# Patient Record
Sex: Female | Born: 1995 | Race: Black or African American | Hispanic: No | Marital: Married | Smoking: Never smoker
Health system: Southern US, Community
[De-identification: ages and names within clinical notes are randomized; demographics above are authoritative.]

## PROBLEM LIST (undated history)

## (undated) DIAGNOSIS — D649 Anemia, unspecified: Secondary | ICD-10-CM

## (undated) DIAGNOSIS — A6000 Herpesviral infection of urogenital system, unspecified: Secondary | ICD-10-CM

## (undated) HISTORY — PX: ANKLE SURGERY: SHX546

## (undated) HISTORY — DX: Anemia, unspecified: D64.9

---

## 2013-07-06 ENCOUNTER — Emergency Department (HOSPITAL_COMMUNITY)
Admission: EM | Admit: 2013-07-06 | Discharge: 2013-07-07 | Disposition: A | Discharge status: Home / Self Care | Payer: BC Managed Care – PPO | Attending: Emergency Medicine | Admitting: Emergency Medicine

## 2013-07-06 ENCOUNTER — Encounter (HOSPITAL_COMMUNITY): Payer: Self-pay | Admitting: Emergency Medicine

## 2013-07-06 DIAGNOSIS — A6000 Herpesviral infection of urogenital system, unspecified: Secondary | ICD-10-CM | POA: Insufficient documentation

## 2013-07-06 DIAGNOSIS — Z79899 Other long term (current) drug therapy: Secondary | ICD-10-CM | POA: Insufficient documentation

## 2013-07-06 DIAGNOSIS — Z3202 Encounter for pregnancy test, result negative: Secondary | ICD-10-CM | POA: Insufficient documentation

## 2013-07-06 DIAGNOSIS — N898 Other specified noninflammatory disorders of vagina: Secondary | ICD-10-CM | POA: Insufficient documentation

## 2013-07-06 HISTORY — DX: Herpesviral infection of urogenital system, unspecified: A60.00

## 2013-07-06 NOTE — ED Notes (Addendum)
Pt c/o vaginal bleeding (spotting) x 3 days. Denies fever, denies n/v/d. Denies pain, denies urinary s/s

## 2013-07-07 LAB — WET PREP, GENITAL
Trich, Wet Prep: NONE SEEN
Yeast Wet Prep HPF POC: NONE SEEN

## 2013-07-07 LAB — GC/CHLAMYDIA PROBE AMP
CT Probe RNA: NEGATIVE
GC Probe RNA: NEGATIVE

## 2013-07-07 LAB — POCT PREGNANCY, URINE: Preg Test, Ur: NEGATIVE

## 2013-07-07 MED ORDER — LIDOCAINE HCL 1 % IJ SOLN
INTRAMUSCULAR | Status: AC
  Administered 2013-07-07: 20 mL
  Filled 2013-07-07: qty 20

## 2013-07-07 MED ORDER — AZITHROMYCIN 250 MG PO TABS
1000.0000 mg | ORAL_TABLET | Freq: Once | ORAL | Status: AC
  Administered 2013-07-07: 1000 mg via ORAL
  Filled 2013-07-07: qty 4

## 2013-07-07 MED ORDER — CEFTRIAXONE SODIUM 250 MG IJ SOLR
250.0000 mg | Freq: Once | INTRAMUSCULAR | Status: AC
  Administered 2013-07-07: 250 mg via INTRAMUSCULAR
  Filled 2013-07-07: qty 250

## 2013-07-07 NOTE — ED Provider Notes (Signed)
Medical screening examination/treatment/procedure(s) were performed by non-physician practitioner and as supervising physician I was immediately available for consultation/collaboration.  Sunnie Nielsen, MD 07/07/13 [phone]

## 2013-07-07 NOTE — ED Provider Notes (Signed)
CSN: [id number]     Arrival date & time 07/06/13  2332 History     First MD Initiated Contact with Patient 07/06/13 2353     Chief Complaint  Patient presents with  . Vaginal Bleeding   (Consider location/radiation/quality/duration/timing/severity/associated sxs/prior Treatment) HPI Comments: LMP 1 week ago normal uses BC pills has not missed any, noticed a vaginal discharge 3 days ago that may have a bloody tinge to it.  Denies dysuria, N/V/C, fever, myalgia   Patient is a 17 y.o. female presenting with vaginal bleeding. The history is provided by the patient.  Vaginal Bleeding Quality:  Lighter than menses Severity:  Mild Onset quality:  Gradual Duration:  3 days Timing:  Intermittent Progression:  Unchanged Menstrual history:  Regular Possible pregnancy: no   Context: spontaneously   Relieved by:  None tried Worsened by:  Nothing tried Ineffective treatments:  None tried Associated symptoms: vaginal discharge   Associated symptoms: no abdominal pain, no back pain, no dizziness, no dyspareunia, no dysuria, no fatigue, no fever and no nausea   Risk factors: unprotected sex   Risk factors: no gynecological surgery     Past Medical History  Diagnosis Date  . Genital herpes    Past Surgical History  Procedure Laterality Date  . Ankle surgery Right    No family history on file. History  Substance Use Topics  . Smoking status: Never Smoker   . Smokeless tobacco: Not on file  . Alcohol Use: No   OB History   Grav Para Term Preterm Abortions TAB SAB Ect Mult Living                 Review of Systems  Constitutional: Negative for fever and fatigue.  Gastrointestinal: Negative for nausea, vomiting and abdominal pain.  Genitourinary: Positive for vaginal bleeding and vaginal discharge. Negative for dysuria, frequency, flank pain, menstrual problem and dyspareunia.  Musculoskeletal: Negative for back pain.  Skin: Negative for rash.  Neurological: Negative for dizziness.   All other systems reviewed and are negative.    Allergies  Review of patient's allergies indicates no known allergies.  Home Medications   Current Outpatient Rx  Name  Route  Sig  Dispense  Refill  . acetaminophen (TYLENOL) 500 MG tablet   Oral   Take 500 mg by mouth every 6 (six) hours as needed for pain.         Marland Kitchen acyclovir (ZOVIRAX) 400 MG tablet   Oral   Take 400 mg by mouth 2 (two) times daily.         Marland Kitchen PRESCRIPTION MEDICATION   Oral   Take 1 tablet by mouth every morning. Birth control          BP 130/71  Pulse 82  Temp(Src) 98.8 F (37.1 C) (Oral)  Resp 16  Ht 5\' 9"  (1.753 m)  Wt 148 lb (67.132 kg)  BMI 21.85 kg/m2  SpO2 100%  LMP 06/23/2013 Physical Exam  Constitutional: She appears well-developed and well-nourished.  HENT:  Head: Normocephalic.  Eyes: Pupils are equal, round, and reactive to light.  Neck: Normal range of motion.  Cardiovascular: Normal rate.   Pulmonary/Chest: Effort normal and breath sounds normal.  Abdominal: Soft. Bowel sounds are normal. She exhibits no distension.  Genitourinary: Guaiac negative stool. Uterus is not enlarged and not tender. Cervix exhibits no discharge. Right adnexum displays no tenderness and no fullness. Left adnexum displays no tenderness and no fullness. There is tenderness around the vagina. Vaginal discharge found.  Musculoskeletal: Normal range of motion.  Neurological: She is alert.  Skin: Skin is warm and dry. No rash noted. No erythema.  Psychiatric: Her behavior is normal.    ED Course   Procedures (including critical care time)  Labs Reviewed  WET PREP, GENITAL - Abnormal; Notable for the following:    Clue Cells Wet Prep HPF POC FEW (*)    WBC, Wet Prep HPF POC FEW (*)    All other components within normal limits  GC/CHLAMYDIA PROBE AMP  POCT PREGNANCY, URINE   No results found. 1. Vaginal discharge     MDM     Arman Filter, NP 07/07/13 0147

## 2013-07-07 NOTE — ED Notes (Signed)
Pelvic setup at bedside.

## 2015-04-10 ENCOUNTER — Emergency Department (HOSPITAL_COMMUNITY): Payer: Medicaid Other

## 2015-04-10 ENCOUNTER — Emergency Department (HOSPITAL_COMMUNITY)
Admission: EM | Admit: 2015-04-10 | Discharge: 2015-04-10 | Disposition: A | Discharge status: Home / Self Care | Payer: Medicaid Other | Attending: Emergency Medicine | Admitting: Emergency Medicine

## 2015-04-10 ENCOUNTER — Encounter (HOSPITAL_COMMUNITY): Payer: Self-pay | Admitting: Emergency Medicine

## 2015-04-10 DIAGNOSIS — Y92009 Unspecified place in unspecified non-institutional (private) residence as the place of occurrence of the external cause: Secondary | ICD-10-CM | POA: Diagnosis not present

## 2015-04-10 DIAGNOSIS — Z9889 Other specified postprocedural states: Secondary | ICD-10-CM | POA: Insufficient documentation

## 2015-04-10 DIAGNOSIS — Y998 Other external cause status: Secondary | ICD-10-CM | POA: Diagnosis not present

## 2015-04-10 DIAGNOSIS — Z8619 Personal history of other infectious and parasitic diseases: Secondary | ICD-10-CM | POA: Diagnosis not present

## 2015-04-10 DIAGNOSIS — W1849XA Other slipping, tripping and stumbling without falling, initial encounter: Secondary | ICD-10-CM | POA: Insufficient documentation

## 2015-04-10 DIAGNOSIS — Z793 Long term (current) use of hormonal contraceptives: Secondary | ICD-10-CM | POA: Diagnosis not present

## 2015-04-10 DIAGNOSIS — S93401A Sprain of unspecified ligament of right ankle, initial encounter: Secondary | ICD-10-CM

## 2015-04-10 DIAGNOSIS — Y9389 Activity, other specified: Secondary | ICD-10-CM | POA: Insufficient documentation

## 2015-04-10 DIAGNOSIS — S99911A Unspecified injury of right ankle, initial encounter: Secondary | ICD-10-CM | POA: Diagnosis present

## 2015-04-10 MED ORDER — TRAMADOL HCL 50 MG PO TABS
50.0000 mg | ORAL_TABLET | Freq: Once | ORAL | Status: AC
  Administered 2015-04-10: 50 mg via ORAL
  Filled 2015-04-10: qty 1

## 2015-04-10 MED ORDER — IBUPROFEN 600 MG PO TABS: 600.0000 mg | ORAL_TABLET | Freq: Four times a day (QID) | ORAL | Status: DC | PRN

## 2015-04-10 NOTE — ED Notes (Signed)
Patient is alert and oriented x3.  She was given DC instructions and follow up visit instructions.  Patient gave verbal understanding. She was DC ambulatory under her own power to home.  V/S stable.  He was not showing any signs of distress on DC 

## 2015-04-10 NOTE — Discharge Instructions (Signed)
Ankle Sprain °An ankle sprain is an injury to the strong, fibrous tissues (ligaments) that hold the bones of your ankle joint together.  °CAUSES °An ankle sprain is usually caused by a fall or by twisting your ankle. Ankle sprains most commonly occur when you step on the outer edge of your foot, and your ankle turns inward. People who participate in sports are more prone to these types of injuries.  °SYMPTOMS  °· Pain in your ankle. The pain may be present at rest or only when you are trying to stand or walk. °· Swelling. °· Bruising. Bruising may develop immediately or within 1 to 2 days after your injury. °· Difficulty standing or walking, particularly when turning corners or changing directions. °DIAGNOSIS  °Your caregiver will ask you details about your injury and perform a physical exam of your ankle to determine if you have an ankle sprain. During the physical exam, your caregiver will press on and apply pressure to specific areas of your foot and ankle. Your caregiver will try to move your ankle in certain ways. An X-ray exam may be done to be sure a bone was not broken or a ligament did not separate from one of the bones in your ankle (avulsion fracture).  °TREATMENT  °Certain types of braces can help stabilize your ankle. Your caregiver can make a recommendation for this. Your caregiver may recommend the use of medicine for pain. If your sprain is severe, your caregiver may refer you to a surgeon who helps to restore function to parts of your skeletal system (orthopedist) or a physical therapist. °HOME CARE INSTRUCTIONS  °· Apply ice to your injury for 1-2 days or as directed by your caregiver. Applying ice helps to reduce inflammation and pain. °· Put ice in a plastic bag. °· Place a towel between your skin and the bag. °· Leave the ice on for 15-20 minutes at a time, every 2 hours while you are awake. °· Only take over-the-counter or prescription medicines for pain, discomfort, or fever as directed by  your caregiver. °· Elevate your injured ankle above the level of your heart as much as possible for 2-3 days. °· If your caregiver recommends crutches, use them as instructed. Gradually put weight on the affected ankle. Continue to use crutches or a cane until you can walk without feeling pain in your ankle. °· If you have a plaster splint, wear the splint as directed by your caregiver. Do not rest it on anything harder than a pillow for the first 24 hours. Do not put weight on it. Do not get it wet. You may take it off to take a shower or bath. °· You may have been given an elastic bandage to wear around your ankle to provide support. If the elastic bandage is too tight (you have numbness or tingling in your foot or your foot becomes cold and blue), adjust the bandage to make it comfortable. °· If you have an air splint, you may blow more air into it or let air out to make it more comfortable. You may take your splint off at night and before taking a shower or bath. Wiggle your toes in the splint several times per day to decrease swelling. °SEEK MEDICAL CARE IF:  °· You have rapidly increasing bruising or swelling. °· Your toes feel extremely cold or you lose feeling in your foot. °· Your pain is not relieved with medicine. °SEEK IMMEDIATE MEDICAL CARE IF: °· Your toes are numb or blue. °·   You have severe pain that is increasing. °MAKE SURE YOU:  °· Understand these instructions. °· Will watch your condition. °· Will get help right away if you are not doing well or get worse. °Document Released: 11/14/2005 Document Revised: 08/08/2012 Document Reviewed: 11/26/2011 °ExitCare® Patient Information ©2015 [employer]. This information is not intended to replace advice given to you by your health care provider. Make sure you discuss any questions you have with your health care provider. ° ° ° °RICE: Routine Care for Injuries °The routine care of many injuries includes Rest, Ice, Compression, and Elevation (RICE). °HOME  CARE INSTRUCTIONS °· Rest is needed to allow your body to heal. Routine activities can usually be resumed when comfortable. Injured tendons and bones can take up to 6 weeks to heal. Tendons are the cord-like structures that attach muscle to bone. °· Ice following an injury helps keep the swelling down and reduces pain. °¨ Put ice in a plastic bag. °¨ Place a towel between your skin and the bag. °¨ Leave the ice on for 15-20 minutes, 3-4 times a day, or as directed by your health care provider. Do this while awake, for the first 24 to 48 hours. After that, continue as directed by your caregiver. °· Compression helps keep swelling down. It also gives support and helps with discomfort. If an elastic bandage has been applied, it should be removed and reapplied every 3 to 4 hours. It should not be applied tightly, but firmly enough to keep swelling down. Watch fingers or toes for swelling, bluish discoloration, coldness, numbness, or excessive pain. If any of these problems occur, remove the bandage and reapply loosely. Contact your caregiver if these problems continue. °· Elevation helps reduce swelling and decreases pain. With extremities, such as the arms, hands, legs, and feet, the injured area should be placed near or above the level of the heart, if possible. °SEEK IMMEDIATE MEDICAL CARE IF: °· You have persistent pain and swelling. °· You develop redness, numbness, or unexpected weakness. °· Your symptoms are getting worse rather than improving after several days. °These symptoms may indicate that further evaluation or further X-rays are needed. Sometimes, X-rays may not show a small broken bone (fracture) until 1 week or 10 days later. Make a follow-up appointment with your caregiver. Ask when your X-ray results will be ready. Make sure you get your X-ray results. °Document Released: 02/26/2001 Document Revised: 11/19/2013 Document Reviewed: 04/15/2011 °ExitCare® Patient Information ©2015 [employer]. This  information is not intended to replace advice given to you by your health care provider. Make sure you discuss any questions you have with your health care provider. ° °

## 2015-04-10 NOTE — ED Provider Notes (Signed)
CSN: [id number]     Arrival date & time 04/10/15  [id number] History   First MD Initiated Contact with Patient 04/10/15 0207     Chief Complaint  Patient presents with  . Ankle Injury    (Consider location/radiation/quality/duration/timing/severity/associated sxs/prior Treatment) Patient is a 19 y.o. female presenting with foot injury. The history is provided by the patient. No language interpreter was used.  Foot Injury Location:  Ankle Time since incident:  2 hours Injury: yes   Mechanism of injury comment:  Twisted while walking down steps Ankle location:  R ankle Pain details:    Quality:  Aching and throbbing   Radiates to:  Does not radiate   Onset quality:  Sudden   Timing:  Constant   Progression:  Waxing and waning Chronicity:  New Foreign body present:  No foreign bodies Prior injury to area:  Yes (Prior fx requiring ORIF) Relieved by:  Nothing Worsened by:  Bearing weight Ineffective treatments:  None tried Associated symptoms: swelling   Associated symptoms: no decreased ROM, no muscle weakness, no numbness and no tingling     Past Medical History  Diagnosis Date  . Genital herpes    Past Surgical History  Procedure Laterality Date  . Ankle surgery Right    Family History  Problem Relation Age of Onset  . Hypertension Other   . Diabetes Other   . Cancer Other    History  Substance Use Topics  . Smoking status: Never Smoker   . Smokeless tobacco: Not on file  . Alcohol Use: Yes     Comment: occ   OB History    No data available      Review of Systems  Musculoskeletal: Positive for joint swelling and arthralgias.  All other systems reviewed and are negative.   Allergies  Review of patient's allergies indicates no known allergies.  Home Medications   Prior to Admission medications   Medication Sig Start Date End Date Taking? Authorizing Provider  acetaminophen (TYLENOL) 500 MG tablet Take 500 mg by mouth every 6 (six) hours as needed for pain.     Historical Provider, MD  acyclovir (ZOVIRAX) 400 MG tablet Take 400 mg by mouth 2 (two) times daily.    Historical Provider, MD  ibuprofen (ADVIL,MOTRIN) 600 MG tablet Take 1 tablet (600 mg total) by mouth every 6 (six) hours as needed. 04/10/15   Antony MaduraKelly Mykira Hofmeister, PA-C  PRESCRIPTION MEDICATION Take 1 tablet by mouth every morning. Birth control    Historical Provider, MD   BP 112/61 mmHg  Pulse 89  Temp(Src) 98.4 F (36.9 C) (Oral)  Resp 16  SpO2 99%   Physical Exam  Constitutional: She is oriented to person, place, and time. She appears well-developed and well-nourished. No distress.  HENT:  Head: Normocephalic and atraumatic.  Eyes: Conjunctivae and EOM are normal. No scleral icterus.  Neck: Normal range of motion.  Cardiovascular: Normal rate, regular rhythm and intact distal pulses.   DP and PT pulses 2+ in the right lower extremity  Pulmonary/Chest: Effort normal. No respiratory distress.  Musculoskeletal: Normal range of motion.       Right ankle: She exhibits swelling. She exhibits normal range of motion, no deformity and normal pulse. Tenderness. Medial malleolus tenderness found. Achilles tendon normal.       Right foot: Normal.  Neurological: She is alert and oriented to person, place, and time. She exhibits normal muscle tone. Coordination normal.  Sensation to light touch intact. Patient able to wiggle all toes.  Skin: Skin is warm and dry. No rash noted. She is not diaphoretic. No erythema. No pallor.  Psychiatric: She has a normal mood and affect. Her behavior is normal.  Nursing note and vitals reviewed.   ED Course  Procedures (including critical care time) Labs Review Labs Reviewed - No data to display  Imaging Review Dg Ankle Complete Right  04/10/2015   CLINICAL DATA:  Twisting injury around 10:00 p.m.  EXAM: RIGHT ANKLE - COMPLETE 3+ VIEW  COMPARISON:  None.  FINDINGS: There is plate screw and inter fragmentary screw distal fibular fixation. There are 2 medial  malleolar screws. There is no evidence of acute fracture. The mortise is symmetric. The fixation hardware appears intact.  IMPRESSION: Negative for acute fracture   Electronically Signed   By: Ellery Plunkaniel R Mitchell M.D.   On: 04/10/2015 02:44     EKG Interpretation None      MDM   Final diagnoses:  Ankle sprain, right, initial encounter    19 year old female with ankle sprain. This is uncomplicated. X-ray negative for fracture, dislocation, or bony deformity. Patient given ASO and crutches in ED. Have advised RICE and NSAIDs. Orthopedic referral given and return precautions discussed. Patient agreeable to plan with no unaddressed concerns.   Filed Vitals:   04/10/15 0203  BP: 112/61  Pulse: 89  Temp: 98.4 F (36.9 C)  TempSrc: Oral  Resp: 16  SpO2: 99%     Antony MaduraKelly Gareth Fitzner, PA-C 04/10/15 0300  April Palumbo, MD 04/10/15 [phone]

## 2015-04-10 NOTE — ED Notes (Signed)
Pt states she was coming down the steps at a friends house and missed the last step  Pt states she injured her right ankle  Pt has swelling noted  Pt has hx of ankle surgery in the past

## 2015-11-29 NOTE — L&D Delivery Note (Addendum)
Delivery Note At 2:14 PM a viable and healthy female was delivered by Dr Jolayne Pantheronstant via Vaginal, Spontaneous Delivery (Presentation: OA  ).  APGAR: 8, 9; weight  pending.   Placenta status: spontaneous and grossly intact with 3 vessel Cord:  with the following complications:  Nuchal cord   Anesthesia:  Epidural and local for repair Episiotomy:  none Lacerations:  Shallow second degreee perineal with repair by Artelia LarocheM Nicholas Ossa CNM Suture Repair: 3.0 monocryl Est. Blood Loss (mL):  250  Mom to postpartum.  Baby to Couplet care / Skin to Skin.  [address] [facility] Surgery CenterWILLIAMS,Dario Yono 10/07/2016, 2:44 PM

## 2016-02-01 ENCOUNTER — Encounter: Payer: Self-pay | Admitting: Obstetrics & Gynecology

## 2016-02-01 ENCOUNTER — Ambulatory Visit (INDEPENDENT_AMBULATORY_CARE_PROVIDER_SITE_OTHER): Payer: BLUE CROSS/BLUE SHIELD

## 2016-02-01 DIAGNOSIS — Z3201 Encounter for pregnancy test, result positive: Secondary | ICD-10-CM

## 2016-02-01 LAB — POCT PREGNANCY, URINE: Preg Test, Ur: POSITIVE — AB

## 2016-02-01 NOTE — Progress Notes (Signed)
Pt here today for a pregnancy test.  Resulted positive.  Proof of pregnancy provided to the pt to start prenatal care at [facility].

## 2016-02-03 ENCOUNTER — Encounter: Payer: Self-pay | Admitting: Certified Nurse Midwife

## 2016-02-07 ENCOUNTER — Emergency Department (HOSPITAL_COMMUNITY)
Admission: EM | Admit: 2016-02-07 | Discharge: 2016-02-07 | Disposition: A | Discharge status: Home / Self Care | Payer: BLUE CROSS/BLUE SHIELD | Attending: Emergency Medicine | Admitting: Emergency Medicine

## 2016-02-07 ENCOUNTER — Encounter (HOSPITAL_COMMUNITY): Payer: Self-pay | Admitting: Family Medicine

## 2016-02-07 DIAGNOSIS — O99511 Diseases of the respiratory system complicating pregnancy, first trimester: Secondary | ICD-10-CM | POA: Diagnosis not present

## 2016-02-07 DIAGNOSIS — Z3A01 Less than 8 weeks gestation of pregnancy: Secondary | ICD-10-CM | POA: Insufficient documentation

## 2016-02-07 DIAGNOSIS — Z79899 Other long term (current) drug therapy: Secondary | ICD-10-CM | POA: Insufficient documentation

## 2016-02-07 DIAGNOSIS — Z8619 Personal history of other infectious and parasitic diseases: Secondary | ICD-10-CM | POA: Diagnosis not present

## 2016-02-07 DIAGNOSIS — J069 Acute upper respiratory infection, unspecified: Secondary | ICD-10-CM | POA: Diagnosis not present

## 2016-02-07 NOTE — Discharge Instructions (Signed)
1. Medications: tylenol for pain, usual home medications 2. Treatment: rest, drink plenty of fluids; try warm honey, tea, throat lozenges for additional symptom relief 3. Follow Up: please followup with your primary doctor for discussion of your diagnoses and further evaluation after today's visit; if you do not have a primary care doctor use the phone number listed in your discharge paperwork to find one; please return to the ER for high fever, severe shortness of breath, abdominal pain, vaginal bleeding, new or worsening symptoms   Upper Respiratory Infection, Adult Most upper respiratory infections (URIs) are caused by a virus. A URI affects the nose, throat, and upper air passages. The most common type of URI is often called "the common cold." HOME CARE   Take medicines only as told by your doctor.  Gargle warm saltwater or take cough drops to comfort your throat as told by your doctor.  Use a warm mist humidifier or inhale steam from a shower to increase air moisture. This may make it easier to breathe.  Drink enough fluid to keep your pee (urine) clear or pale yellow.  Eat soups and other clear broths.  Have a healthy diet.  Rest as needed.  Go back to work when your fever is gone or your doctor says it is okay.  You may need to stay home longer to avoid giving your URI to others.  You can also wear a face mask and wash your hands often to prevent spread of the virus.  Use your inhaler more if you have asthma.  Do not use any tobacco products, including cigarettes, chewing tobacco, or electronic cigarettes. If you need help quitting, ask your doctor. GET HELP IF:  You are getting worse, not better.  Your symptoms are not helped by medicine.  You have chills.  You are getting more short of breath.  You have brown or red mucus.  You have yellow or brown discharge from your nose.  You have pain in your face, especially when you bend forward.  You have a  fever.  You have puffy (swollen) neck glands.  You have pain while swallowing.  You have white areas in the back of your throat. GET HELP RIGHT AWAY IF:   You have very bad or constant:  Headache.  Ear pain.  Pain in your forehead, behind your eyes, and over your cheekbones (sinus pain).  Chest pain.  You have long-lasting (chronic) lung disease and any of the following:  Wheezing.  Long-lasting cough.  Coughing up blood.  A change in your usual mucus.  You have a stiff neck.  You have changes in your:  Vision.  Hearing.  Thinking.  Mood. MAKE SURE YOU:   Understand these instructions.  Will watch your condition.  Will get help right away if you are not doing well or get worse.   This information is not intended to replace advice given to you by your health care provider. Make sure you discuss any questions you have with your health care provider.   Document Released: 05/02/2008 Document Revised: 03/31/2015 Document Reviewed: 02/19/2014 Elsevier Interactive Patient Education [employer]2016 [employer]

## 2016-02-07 NOTE — ED Notes (Signed)
Pt is complaining of URI symptoms (cough, sore throat, nasal/chest congestion). Symptoms started on Thursday. Took Benadryl for symptoms. Pregnant positive but thinks she is [redacted] weeks along.

## 2016-02-07 NOTE — ED Provider Notes (Signed)
CSN: [id number]     Arrival date & time 02/07/16  1625 History  By signing my name below, I, Doreatha Martin, attest that this documentation has been prepared under the direction and in the presence of Mady Gemma, PA-C. Electronically Signed: Doreatha Martin, ED Scribe. 02/07/2016. 5:20 PM.    Chief Complaint  Patient presents with  . URI    The history is provided by the patient. No language interpreter was used.    HPI Comments: Kristin Stokes is a 20 y.o. female who is ~[redacted] weeks pregnant G1P0A0 with no pertinent PMH who presents to the Emergency Department complaining of moderate nasal congestion onset 3 days ago with associated sinus pressure, dry cough, and intermittent throat pain secondary to cough. Pt states that coughing exacerbates her throat pain. Pt notes she has taken benadryl with no relief of symptoms. No known sick contacts with similar symptoms. She reports that she has not had a flu shot this year. She states her urine preg was positive at the [facility] and she has her first appointment with an OB in 10 days. Pt is not currently followed by a PCP. She denies fever, chills, nausea, emesis, abdominal pain, CP, vaginal bleeding, or abnormal vaginal discharge.     Past Medical History  Diagnosis Date  . Genital herpes    Past Surgical History  Procedure Laterality Date  . Ankle surgery Right    Family History  Problem Relation Age of Onset  . Hypertension Other   . Diabetes Other   . Cancer Other    Social History  Substance Use Topics  . Smoking status: Never Smoker   . Smokeless tobacco: None  . Alcohol Use: No     Comment: Not since finding out of pregnancy. Before hand, 1-2 times a month.    OB History    Gravida Para Term Preterm AB TAB SAB Ectopic Multiple Living   1               Review of Systems  Constitutional: Negative for fever and chills.  HENT: Positive for congestion and sinus pressure.   Respiratory: Positive for cough.    Gastrointestinal: Negative for nausea, vomiting and abdominal pain.  Genitourinary: Negative for vaginal bleeding and vaginal discharge.    Allergies  Review of patient's allergies indicates no known allergies.  Home Medications   Prior to Admission medications   Medication Sig Start Date End Date Taking? Authorizing Provider  acetaminophen (TYLENOL) 500 MG tablet Take 500 mg by mouth every 6 (six) hours as needed for pain.    Historical Provider, MD  acyclovir (ZOVIRAX) 400 MG tablet Take 400 mg by mouth 2 (two) times daily.    Historical Provider, MD  ibuprofen (ADVIL,MOTRIN) 600 MG tablet Take 1 tablet (600 mg total) by mouth every 6 (six) hours as needed. 04/10/15   Antony Madura, PA-C  PRESCRIPTION MEDICATION Take 1 tablet by mouth every morning. Birth control    Historical Provider, MD    BP 115/72 mmHg  Pulse 100  Temp(Src) 98.3 F (36.8 C) (Oral)  Resp 20  Ht  (1.753 m)  Wt 68.947 kg  BMI 22.44 kg/m2  SpO2 100%  LMP  Physical Exam  Constitutional: She is oriented to person, place, and time. She appears well-developed and well-nourished. No distress.  HENT:  Head: Normocephalic and atraumatic.  Right Ear: External ear normal.  Left Ear: External ear normal.  Nose: Nose normal. Right sinus exhibits no maxillary sinus tenderness  and no frontal sinus tenderness. Left sinus exhibits no maxillary sinus tenderness and no frontal sinus tenderness.  Mouth/Throat: Oropharynx is clear and moist. No oropharyngeal exudate, posterior oropharyngeal edema, posterior oropharyngeal erythema or tonsillar abscesses.  Eyes: Conjunctivae and EOM are normal. Pupils are equal, round, and reactive to light. Right eye exhibits no discharge. Left eye exhibits no discharge. No scleral icterus.  Neck: Normal range of motion. Neck supple.  Cardiovascular: Normal rate, regular rhythm, normal heart sounds and intact distal pulses.   Pulmonary/Chest: Effort normal and breath sounds normal. No  respiratory distress. She has no wheezes. She has no rales. She exhibits no tenderness.  Abdominal: Soft. Bowel sounds are normal. She exhibits no distension and no mass. There is no tenderness. There is no rebound and no guarding.  Musculoskeletal: Normal range of motion. She exhibits no edema or tenderness.  Neurological: She is alert and oriented to person, place, and time.  Skin: Skin is warm and dry. She is not diaphoretic.  Psychiatric: She has a normal mood and affect. Her behavior is normal.  Nursing note and vitals reviewed.   ED Course  Procedures (including critical care time)  DIAGNOSTIC STUDIES: Oxygen Saturation is 100% on RA, normal by my interpretation.    COORDINATION OF CARE: 5:18 PM Discussed treatment plan with pt at bedside which includes symptomatic treatment and pt agreed to plan.  MDM   Final diagnoses:  URI (upper respiratory infection)    20 year old female presents with nasal congestion and nonproductive cough. Denies fever or sore throat, though states she experiences mild throat pain while coughing. States she is approximately [redacted] weeks pregnant and has an OB appt scheduled for the 22nd of this month. She denies abdominal pain or vaginal bleeding. Patient is afebrile. Vital signs stable. No TTP to sinuses bilaterally. Posterior oropharynx without erythema, edema, or exudate. Lungs clear to auscultation bilaterally. Abdomen soft, non-tender, non-distended.  Patient is non-toxic and well-appearing, feel she is stable for discharge at this time. Given no fever, reassuring lung exam, and stable vitals (no tachypnea or hypoxia), do not feel imaging is indicated at this time, as I have a low suspicion for pneumonia. Symptoms likely viral. Discussed supportive care, including warm honey, tea, throat lozenges, as well as tylenol for pain. Patient to follow-up with PCP. Strict return precautions discussed. Patient verbalizes her understanding and is in agreement with  plan.  BP 115/72 mmHg  Pulse 100  Temp(Src) 98.3 F (36.8 C) (Oral)  Resp 20  Ht 5\' 9"  (1.753 m)  Wt 68.947 kg  BMI 22.44 kg/m2  SpO2 100%  LMP    I personally performed the services described in this documentation, which was scribed in my presence. The recorded information has been reviewed and is accurate.   Mady Gemmalizabeth C Westfall, PA-C 02/07/16 1740  Arby BarretteMarcy Pfeiffer, MD 02/10/16 [phone]

## 2016-02-09 ENCOUNTER — Encounter: Payer: Medicaid Other | Admitting: Certified Nurse Midwife

## 2016-02-14 ENCOUNTER — Encounter (HOSPITAL_COMMUNITY): Payer: Self-pay | Admitting: Emergency Medicine

## 2016-02-14 ENCOUNTER — Emergency Department (HOSPITAL_COMMUNITY): Payer: BLUE CROSS/BLUE SHIELD

## 2016-02-14 ENCOUNTER — Emergency Department (HOSPITAL_COMMUNITY)
Admission: EM | Admit: 2016-02-14 | Discharge: 2016-02-14 | Disposition: A | Discharge status: Home / Self Care | Payer: BLUE CROSS/BLUE SHIELD | Attending: Emergency Medicine | Admitting: Emergency Medicine

## 2016-02-14 DIAGNOSIS — N939 Abnormal uterine and vaginal bleeding, unspecified: Secondary | ICD-10-CM

## 2016-02-14 DIAGNOSIS — O209 Hemorrhage in early pregnancy, unspecified: Secondary | ICD-10-CM | POA: Diagnosis present

## 2016-02-14 DIAGNOSIS — Z3490 Encounter for supervision of normal pregnancy, unspecified, unspecified trimester: Secondary | ICD-10-CM

## 2016-02-14 DIAGNOSIS — Z3A01 Less than 8 weeks gestation of pregnancy: Secondary | ICD-10-CM | POA: Diagnosis not present

## 2016-02-14 DIAGNOSIS — Z79899 Other long term (current) drug therapy: Secondary | ICD-10-CM | POA: Diagnosis not present

## 2016-02-14 DIAGNOSIS — Z8619 Personal history of other infectious and parasitic diseases: Secondary | ICD-10-CM | POA: Diagnosis not present

## 2016-02-14 LAB — BASIC METABOLIC PANEL
Anion gap: 9 (ref 5–15)
BUN: 7 mg/dL (ref 6–20)
CO2: 23 mmol/L (ref 22–32)
Calcium: 9.3 mg/dL (ref 8.9–10.3)
Chloride: 109 mmol/L (ref 101–111)
Creatinine, Ser: 0.58 mg/dL (ref 0.44–1.00)
GFR calc Af Amer: >60 mL/min (ref >60)
GFR calc non Af Amer: >60 mL/min (ref >60)
Glucose, Bld: 94 mg/dL (ref 65–99)
Potassium: 4 mmol/L (ref 3.5–5.1)
Sodium: 141 mmol/L (ref 135–145)

## 2016-02-14 LAB — CBC WITH DIFFERENTIAL/PLATELET
Basophils Absolute: 0 10*3/uL (ref 0.0–0.1)
Basophils Relative: 0 %
Eosinophils Absolute: 0 10*3/uL (ref 0.0–0.7)
Eosinophils Relative: 1 %
HCT: 39.9 % (ref 36.0–46.0)
Hemoglobin: 14.1 g/dL (ref 12.0–15.0)
Lymphocytes Relative: 35 %
Lymphs Abs: 1.8 10*3/uL (ref 0.7–4.0)
MCH: 30.8 pg (ref 26.0–34.0)
MCHC: 35.3 g/dL (ref 30.0–36.0)
MCV: 87.1 fL (ref 78.0–100.0)
Monocytes Absolute: 0.6 10*3/uL (ref 0.1–1.0)
Monocytes Relative: 11 %
Neutro Abs: 2.8 10*3/uL (ref 1.7–7.7)
Neutrophils Relative %: 53 %
Platelets: 335 10*3/uL (ref 150–400)
RBC: 4.58 MIL/uL (ref 3.87–5.11)
RDW: 12.2 % (ref 11.5–15.5)
WBC: 5.3 10*3/uL (ref 4.0–10.5)

## 2016-02-14 LAB — URINE MICROSCOPIC-ADD ON

## 2016-02-14 LAB — HCG, QUANTITATIVE, PREGNANCY: hCG, Beta Chain, Quant, S: 35434 m[IU]/mL — ABNORMAL HIGH

## 2016-02-14 LAB — URINALYSIS, ROUTINE W REFLEX MICROSCOPIC
Bilirubin Urine: NEGATIVE
Glucose, UA: NEGATIVE mg/dL
Ketones, ur: NEGATIVE mg/dL
Leukocytes, UA: NEGATIVE
Nitrite: NEGATIVE
Protein, ur: NEGATIVE mg/dL
Specific Gravity, Urine: 1.013 (ref 1.005–1.030)
pH: 7 (ref 5.0–8.0)

## 2016-02-14 NOTE — ED Provider Notes (Signed)
CSN: [id number]     Arrival date & time 02/14/16  0849 History   First MD Initiated Contact with Patient 02/14/16 [phone]     Chief Complaint  Patient presents with  . Vaginal Bleeding  . 5 w/ pregnant      (Consider location/radiation/quality/duration/timing/severity/associated sxs/prior Treatment) HPI...Marland Kitchen.Marland Kitchen.Gravida 1 para 0 last menstrual period 12/20/2015 presents with a small amount of vaginal bleeding a couple days ago. She is normally healthy. [place] in [place]. No active bleeding now. No fever, sweats, chills. Severity of symptoms is mild. Patient is able to eat. Past Medical History  Diagnosis Date  . Genital herpes    Past Surgical History  Procedure Laterality Date  . Ankle surgery Right    Family History  Problem Relation Age of Onset  . Hypertension Other   . Diabetes Other   . Cancer Other    Social History  Substance Use Topics  . Smoking status: Never Smoker   . Smokeless tobacco: None  . Alcohol Use: No     Comment: Not since finding out of pregnancy. Before hand, 1-2 times a month.    OB History    Gravida Para Term Preterm AB TAB SAB Ectopic Multiple Living   1              Review of Systems  All other systems reviewed and are negative.     Allergies  Review of patient's allergies indicates no known allergies.  Home Medications   Prior to Admission medications   Medication Sig Start Date End Date Taking? Authorizing Provider  acetaminophen (TYLENOL) 500 MG tablet Take 500 mg by mouth every 6 (six) hours as needed for pain.   Yes Historical Provider, MD  diphenhydrAMINE (BENADRYL) 25 MG tablet Take 25 mg by mouth every 6 (six) hours as needed for allergies.   Yes Historical Provider, MD  Prenatal Vit-Fe Fumarate-FA (PRENATAL MULTIVITAMIN) TABS tablet Take 1 tablet by mouth daily at 12 noon.   Yes Historical Provider, MD   BP 108/66 mmHg  Pulse 82  Temp(Src) 98.6 F (37 C) (Oral)  Resp 18  SpO2 96% Physical Exam   Constitutional: She is oriented to person, place, and time. She appears well-developed and well-nourished.  HENT:  Head: Normocephalic and atraumatic.  Eyes: Conjunctivae and EOM are normal. Pupils are equal, round, and reactive to light.  Neck: Normal range of motion. Neck supple.  Cardiovascular: Normal rate and regular rhythm.   Pulmonary/Chest: Effort normal and breath sounds normal.  Abdominal: Soft. Bowel sounds are normal.  Nontender abdomen.  Musculoskeletal: Normal range of motion.  Neurological: She is alert and oriented to person, place, and time.  Skin: Skin is warm and dry.  Psychiatric: She has a normal mood and affect. Her behavior is normal.  Nursing note and vitals reviewed.   ED Course  Procedures (including critical care time) Labs Review Labs Reviewed  URINALYSIS, ROUTINE W REFLEX MICROSCOPIC (NOT AT [facility]) - Abnormal; Notable for the following:    Hgb urine dipstick LARGE (*)    All other components within normal limits  HCG, QUANTITATIVE, PREGNANCY - Abnormal; Notable for the following:    hCG, Beta Chain, Quant, S [id number] (*)    All other components within normal limits  URINE MICROSCOPIC-ADD ON - Abnormal; Notable for the following:    Squamous Epithelial / LPF 6-30 (*)    Bacteria, UA FEW (*)    All other components within normal limits  BASIC METABOLIC PANEL  CBC WITH DIFFERENTIAL/PLATELET    Imaging Review US Ob Comp Less 14 Wks  02/14/2016  CLINICAL DATA:  Vaginal bleeding EXAM: OBSTETRIC <14 WK [place] AND TRANSVAGINAL OB US TECHNIQUE: Both transabdominal and transvaginal ultrasound examinations were performed for complete evaluation of the gestation as well as the maternal uterus, adnexal regions, and pelvic cul-de-sac. Transvaginal technique was performed to assess early pregnancy. COMPARISON:  None. FINDINGS: Intrauterine gestational sac: Visualized/normal in shape. Yolk sac:  Visualized Embryo:  Visualized Cardiac Activity: Visualized Heart Rate: 124   bpm CRL:  5  mm   6 w   1 d       EDC:  September 25, 2016 Subchorionic hemorrhage:  None visualized. Maternal uterus/adnexae: Cervical os is closed. Left ovary appears normal. There is a complex cystic structure containing a hyperechoic focus in the right ovary measuring 2.2 x 2.1 x 2.5 cm. There is mild-to-moderate surrounding free fluid in the right adnexal region. IMPRESSION: Single live intrauterine gestation with estimated gestational age of approximately 6 weeks. Complex predominantly cystic right adnexal structure with surrounding fluid. Suspect rupture of corpus luteum with hemorrhage. Particular attention to this area on subsequent evaluations is warranted. Electronically Signed   By: Bretta Bang III M.D.   On: 02/14/2016 11:53   US Ob Transvaginal  02/14/2016  CLINICAL DATA:  Vaginal bleeding EXAM: OBSTETRIC <14 WK [place] AND TRANSVAGINAL OB US TECHNIQUE: Both transabdominal and transvaginal ultrasound examinations were performed for complete evaluation of the gestation as well as the maternal uterus, adnexal regions, and pelvic cul-de-sac. Transvaginal technique was performed to assess early pregnancy. COMPARISON:  None. FINDINGS: Intrauterine gestational sac: Visualized/normal in shape. Yolk sac:  Visualized Embryo:  Visualized Cardiac Activity: Visualized Heart Rate: 124  bpm CRL:  5  mm   6 w   1 d       EDC:  September 25, 2016 Subchorionic hemorrhage:  None visualized. Maternal uterus/adnexae: Cervical os is closed. Left ovary appears normal. There is a complex cystic structure containing a hyperechoic focus in the right ovary measuring 2.2 x 2.1 x 2.5 cm. There is mild-to-moderate surrounding free fluid in the right adnexal region. IMPRESSION: Single live intrauterine gestation with estimated gestational age of approximately 6 weeks. Complex predominantly cystic right adnexal structure with surrounding fluid. Suspect rupture of corpus luteum with hemorrhage. Particular attention to this area on  subsequent evaluations is warranted. Electronically Signed   By: Bretta Bang III M.D.   On: 02/14/2016 11:53   I have personally reviewed and evaluated these images and lab results as part of my medical decision-making.   EKG Interpretation None      MDM   Final diagnoses:  Intrauterine pregnancy    Patient is well-appearing. Ultrasound reveals a single live intrauterine gestation approximately 6 weeks. Additionally there is a complex cystic right adnexal structure with surrounding fluid. Urinalysis shows no infection. These findings were discussed with the patient and her boyfriend. They will follow-up with OB this week.    Donnetta Hutching, MD 02/14/16 1323

## 2016-02-14 NOTE — ED Notes (Signed)
Patient transported to Ultrasound 

## 2016-02-14 NOTE — ED Notes (Signed)
Pt is [redacted] weeks pregnant. Woke up this morning with bright red vaginal spotting, denies abdominal pain. Pt states this is her first pregnancy. Denies CP, SOB, nausea or vomiting.

## 2016-02-14 NOTE — Discharge Instructions (Signed)
Ultrasound shows a [redacted] week gestation pregnancy. You also have a cystic lesion in your right pelvis. This will need to be followed up by your obstetrician. If you have further problems, recommend going to [facility].

## 2016-02-17 ENCOUNTER — Ambulatory Visit (INDEPENDENT_AMBULATORY_CARE_PROVIDER_SITE_OTHER): Payer: Medicaid Other | Admitting: Certified Nurse Midwife

## 2016-02-17 ENCOUNTER — Encounter: Payer: Self-pay | Admitting: Certified Nurse Midwife

## 2016-02-17 VITALS — BP 108/69 | HR 92 | Temp 98.7°F | Wt 152.0 lb

## 2016-02-17 DIAGNOSIS — Z3403 Encounter for supervision of normal first pregnancy, third trimester: Secondary | ICD-10-CM | POA: Insufficient documentation

## 2016-02-17 DIAGNOSIS — Z3401 Encounter for supervision of normal first pregnancy, first trimester: Secondary | ICD-10-CM

## 2016-02-17 LAB — POCT URINALYSIS DIPSTICK
Bilirubin, UA: NEGATIVE
Blood, UA: NEGATIVE
Glucose, UA: NEGATIVE
Ketones, UA: NEGATIVE
Leukocytes, UA: NEGATIVE
Nitrite, UA: NEGATIVE
Protein, UA: NEGATIVE
Spec Grav, UA: 1.01
Urobilinogen, UA: NEGATIVE
pH, UA: 7

## 2016-02-17 NOTE — Progress Notes (Signed)
Subjective:    Kristin Stokes is being seen today for her first obstetrical visit.  This is not a planned pregnancy. She is at 3737w4d gestation. Her obstetrical history is significant for herpes. Relationship with FOB: significant other, living together. Patient does intend to breast feed. Pregnancy history fully reviewed.  The information documented in the HPI was reviewed and verified.  Menstrual History: OB History    Gravida Para Term Preterm AB TAB SAB Ectopic Multiple Living   1               Menarche age: 813-22 years of age.     No LMP recorded. Patient is pregnant.    Past Medical History  Diagnosis Date  . Genital herpes     Past Surgical History  Procedure Laterality Date  . Ankle surgery Right      (Not in a hospital admission) No Known Allergies  Social History  Substance Use Topics  . Smoking status: Never Smoker   . Smokeless tobacco: Never Used  . Alcohol Use: No     Comment: Not since finding out of pregnancy. Before hand, 1-2 times a month.     Family History  Problem Relation Age of Onset  . Hypertension Other   . Diabetes Other   . Cancer Other   . Asthma Mother   . Anxiety disorder Father   . COPD Maternal Grandmother   . Asthma Maternal Grandmother   . Hypertension Maternal Grandmother      Review of Systems Constitutional: negative for weight loss Gastrointestinal: negative for vomiting Genitourinary:negative for genital lesions and vaginal discharge and dysuria, + spotting Sunday & Monday.  Musculoskeletal:negative for back pain Behavioral/Psych: negative for abusive relationship, depression, illegal drug usage and tobacco use    Objective:    BP 108/69 mmHg  Pulse 92  Temp(Src) 98.7 F (37.1 C)  Wt 152 lb (68.947 kg) General Appearance:    Alert, cooperative, no distress, appears stated age  Head:    Normocephalic, without obvious abnormality, atraumatic  Eyes:    PERRL, conjunctiva/corneas clear, EOM's intact, fundi    benign,  both eyes  Ears:    Normal TM's and external ear canals, both ears  Nose:   Nares normal, septum midline, mucosa normal, no drainage    or sinus tenderness  Throat:   Lips, mucosa, and tongue normal; teeth and gums normal  Neck:   Supple, symmetrical, trachea midline, no adenopathy;    thyroid:  no enlargement/tenderness/nodules; no carotid   bruit or JVD  Back:     Symmetric, no curvature, ROM normal, no CVA tenderness  Lungs:     Clear to auscultation bilaterally, respirations unlabored  Chest Wall:    No tenderness or deformity   Heart:    Regular rate and rhythm, S1 and S2 normal, no murmur, rub   or gallop  Breast Exam:    No tenderness, masses, or nipple abnormality  Abdomen:     Soft, non-tender, bowel sounds active all four quadrants,    no masses, no organomegaly  Genitalia:    Normal female without lesion, discharge or tenderness  Extremities:   Extremities normal, atraumatic, no cyanosis or edema  Pulses:   2+ and symmetric all extremities  Skin:   Skin color, texture, turgor normal, no rashes or lesions  Lymph nodes:   Cervical, supraclavicular, and axillary nodes normal  Neurologic:   CNII-XII intact, normal strength, sensation and reflexes    throughout     Cervix: long,  thick, closed and posterior.      Lab Review Urine pregnancy test Labs reviewed yes Radiologic studies reviewed yes Assessment:    Pregnancy at [redacted]w[redacted]d weeks    Plan:      Prenatal vitamins.  Counseling provided regarding continued use of seat belts, cessation of alcohol consumption, smoking or use of illicit drugs; infection precautions i.e., influenza/TDAP immunizations, toxoplasmosis,CMV, parvovirus, listeria and varicella; workplace safety, exercise during pregnancy; routine dental care, safe medications, sexual activity, hot tubs, saunas, pools, travel, caffeine use, fish and methlymercury, potential toxins, hair treatments, varicose veins Weight gain recommendations per IOM guidelines reviewed:  underweight/BMI< 18.5--> gain 28 - 40 lbs; normal weight/BMI 18.5 - 24.9--> gain 25 - 35 lbs; overweight/BMI 25 - 29.9--> gain 15 - 25 lbs; obese/BMI >30->gain  11 - 20 lbs Problem list reviewed and updated. FIRST/CF mutation testing/NIPT/QUAD SCREEN/fragile X/Ashkenazi Jewish population testing/Spinal muscular atrophy discussed: requested. Role of ultrasound in pregnancy discussed; fetal survey: requested. Amniocentesis discussed: not indicated. VBAC calculator score: VBAC consent form provided No orders of the defined types were placed in this encounter.   Orders Placed This Encounter  Procedures  . Culture, OB Urine  . US OB Transvaginal    Standing Status: Future     Number of Occurrences:      Standing Expiration Date: 04/18/2017    Order Specific Question:  Reason for Exam (SYMPTOM  OR DIAGNOSIS REQUIRED)    Answer:  nucal translucency,    Order Specific Question:  Preferred imaging location?    Answer:  Internal  . US OB Comp Less 14 Wks    Standing Status: Future     Number of Occurrences:      Standing Expiration Date: 04/18/2017    Scheduling Instructions:     Schedule for 5 weeks from now.    Order Specific Question:  Reason for Exam (SYMPTOM  OR DIAGNOSIS REQUIRED)    Answer:  hx of ovarian cyst this pregnancy    Order Specific Question:  Preferred imaging location?    Answer:  Internal  . HIV antibody  . Hemoglobinopathy evaluation  . Varicella zoster antibody, IgG  . VITAMIN D 25 Hydroxy (Vit-D Deficiency, Fractures)  . Prenatal Profile I  . NuSwab Vaginitis Plus (VG+)  . POCT urinalysis dipstick    Follow up in 4 weeks. 50% of 30 min visit spent on counseling and coordination of care.

## 2016-02-19 ENCOUNTER — Other Ambulatory Visit: Payer: Self-pay | Admitting: Certified Nurse Midwife

## 2016-02-19 LAB — PRENATAL PROFILE I(LABCORP)
Antibody Screen: NEGATIVE
Basophils Absolute: 0 10*3/uL (ref 0.0–0.2)
Basos: 0 %
EOS (ABSOLUTE): 0 10*3/uL (ref 0.0–0.4)
Eos: 1 %
Hematocrit: 38.7 % (ref 34.0–46.6)
Hemoglobin: 13.2 g/dL (ref 11.1–15.9)
Hepatitis B Surface Ag: NEGATIVE
Immature Grans (Abs): 0 10*3/uL (ref 0.0–0.1)
Immature Granulocytes: 0 %
Lymphocytes Absolute: 2.5 10*3/uL (ref 0.7–3.1)
Lymphs: 36 %
MCH: 30.2 pg (ref 26.6–33.0)
MCHC: 34.1 g/dL (ref 31.5–35.7)
MCV: 89 fL (ref 79–97)
Monocytes Absolute: 0.5 10*3/uL (ref 0.1–0.9)
Monocytes: 7 %
Neutrophils Absolute: 3.9 10*3/uL (ref 1.4–7.0)
Neutrophils: 56 %
Platelets: 338 10*3/uL (ref 150–379)
RBC: 4.37 x10E6/uL (ref 3.77–5.28)
RDW: 12.8 % (ref 12.3–15.4)
RPR Ser Ql: NONREACTIVE
Rh Factor: POSITIVE
Rubella Antibodies, IGG: 2.47 index (ref >0.99)
WBC: 7 10*3/uL (ref 3.4–10.8)

## 2016-02-19 LAB — HEMOGLOBINOPATHY EVALUATION
HGB C: 0 %
HGB S: 0 %
Hemoglobin A2 Quantitation: 2.4 % (ref 0.7–3.1)
Hemoglobin F Quantitation: 0 % (ref 0.0–2.0)
Hgb A: 97.6 % (ref 94.0–98.0)

## 2016-02-19 LAB — VARICELLA ZOSTER ANTIBODY, IGG: Varicella zoster IgG: <135 index — ABNORMAL LOW (ref >165)

## 2016-02-19 LAB — HIV ANTIBODY (ROUTINE TESTING W REFLEX): HIV Screen 4th Generation wRfx: NONREACTIVE

## 2016-02-19 LAB — VITAMIN D 25 HYDROXY (VIT D DEFICIENCY, FRACTURES): Vit D, 25-Hydroxy: 15.2 ng/mL — ABNORMAL LOW (ref 30.0–100.0)

## 2016-02-20 LAB — NUSWAB VAGINITIS PLUS (VG+)
Atopobium vaginae: HIGH Score — AB
BVAB 2: HIGH Score — AB
Candida albicans, NAA: NEGATIVE
Candida glabrata, NAA: NEGATIVE
Chlamydia trachomatis, NAA: NEGATIVE
Megasphaera 1: HIGH Score — AB
Neisseria gonorrhoeae, NAA: NEGATIVE
Trich vag by NAA: NEGATIVE

## 2016-02-21 LAB — PAP IG AND HPV HIGH-RISK
HPV, high-risk: NEGATIVE
PAP Smear Comment: 0

## 2016-02-22 LAB — URINE CULTURE, OB REFLEX

## 2016-02-22 LAB — CULTURE, OB URINE

## 2016-02-23 ENCOUNTER — Other Ambulatory Visit: Payer: Self-pay | Admitting: Certified Nurse Midwife

## 2016-02-23 DIAGNOSIS — N760 Acute vaginitis: Secondary | ICD-10-CM

## 2016-02-23 DIAGNOSIS — B9689 Other specified bacterial agents as the cause of diseases classified elsewhere: Secondary | ICD-10-CM

## 2016-02-23 MED ORDER — METRONIDAZOLE 0.75 % VA GEL: 1.0000 | Freq: Two times a day (BID) | VAGINAL | Status: DC

## 2016-03-17 ENCOUNTER — Ambulatory Visit (INDEPENDENT_AMBULATORY_CARE_PROVIDER_SITE_OTHER): Payer: Medicaid Other | Admitting: Certified Nurse Midwife

## 2016-03-17 ENCOUNTER — Other Ambulatory Visit: Payer: Self-pay | Admitting: Certified Nurse Midwife

## 2016-03-17 ENCOUNTER — Ambulatory Visit (INDEPENDENT_AMBULATORY_CARE_PROVIDER_SITE_OTHER): Payer: Medicaid Other

## 2016-03-17 ENCOUNTER — Other Ambulatory Visit: Payer: Medicaid Other

## 2016-03-17 VITALS — BP 111/67 | HR 87 | Temp 98.8°F | Wt 146.0 lb

## 2016-03-17 DIAGNOSIS — O3481 Maternal care for other abnormalities of pelvic organs, first trimester: Secondary | ICD-10-CM

## 2016-03-17 DIAGNOSIS — Z3401 Encounter for supervision of normal first pregnancy, first trimester: Secondary | ICD-10-CM

## 2016-03-17 NOTE — Progress Notes (Signed)
PATIENT CONCERNED ABOUT HER SCIATICA & WHAT COULD BE TAKEN FOR THE PAIN

## 2016-03-17 NOTE — Progress Notes (Signed)
  Subjective:    Phineas InchesJazzmyne Galbreath is a 20 y.o. female being seen today for her obstetrical visit. She is at 8256w5d gestation. Patient reports: sciatica.  Problem List Items Addressed This Visit    None     Patient Active Problem List   Diagnosis Date Noted  . Supervision of normal first pregnancy in first trimester 02/17/2016    Objective:     BP 111/67 mmHg  Pulse 87  Temp(Src) 98.8 F (37.1 C)  Wt 146 lb (66.225 kg) Uterine Size: Below umbilicus   Cardiac activity with US.   Normal pelvic US with fetal activity.   Assessment:    Pregnancy @ 4256w5d  weeks Doing well   Resolving corpus luteal cyst  Plan:    Problem list reviewed and updated. Labs reviewed.  Follow up in 4 weeks. FIRST/CF mutation testing/NIPT/QUAD SCREEN/fragile X/Ashkenazi Jewish population testing/Spinal muscular atrophy discussed: requested. Role of ultrasound in pregnancy discussed; fetal survey: requested. Amniocentesis discussed: not indicated. 50% of 15 minute visit spent on counseling and coordination of care.

## 2016-03-31 ENCOUNTER — Other Ambulatory Visit: Payer: Self-pay | Admitting: Certified Nurse Midwife

## 2016-04-05 ENCOUNTER — Other Ambulatory Visit: Payer: Self-pay | Admitting: Certified Nurse Midwife

## 2016-04-13 ENCOUNTER — Encounter: Payer: Medicaid Other | Admitting: Certified Nurse Midwife

## 2016-04-20 ENCOUNTER — Encounter: Payer: Medicaid Other | Admitting: Certified Nurse Midwife

## 2016-04-21 ENCOUNTER — Ambulatory Visit (INDEPENDENT_AMBULATORY_CARE_PROVIDER_SITE_OTHER): Payer: Medicaid Other | Admitting: Certified Nurse Midwife

## 2016-04-21 VITALS — BP 113/73 | HR 98 | Wt 154.0 lb

## 2016-04-21 DIAGNOSIS — Z3402 Encounter for supervision of normal first pregnancy, second trimester: Secondary | ICD-10-CM

## 2016-04-21 DIAGNOSIS — K5901 Slow transit constipation: Secondary | ICD-10-CM

## 2016-04-21 LAB — POCT URINALYSIS DIPSTICK
Bilirubin, UA: NEGATIVE
Blood, UA: NEGATIVE
Glucose, UA: NEGATIVE
Ketones, UA: NEGATIVE
Leukocytes, UA: NEGATIVE
Nitrite, UA: NEGATIVE
Protein, UA: NEGATIVE
Spec Grav, UA: 1.01
Urobilinogen, UA: NEGATIVE
pH, UA: 7

## 2016-04-21 MED ORDER — POLYETHYLENE GLYCOL 3350 17 GM/SCOOP PO POWD: 17.0000 g | Freq: Every day | ORAL | Status: DC

## 2016-04-21 NOTE — Progress Notes (Signed)
  Subjective:    Kristin Stokes is a 20 y.o. female being seen today for her obstetrical visit. She is at 1453w5d gestation. Patient reports: no bleeding, no contractions, no cramping, no leaking and constipation, has tried OTC colace without reliefe.  .  Problem List Items Addressed This Visit    None    Visit Diagnoses    Encounter for supervision of normal first pregnancy in second trimester    -  Primary    Relevant Orders    POCT urinalysis dipstick (Completed)    US OB Comp + 14 Wk    Slow transit constipation        Relevant Medications    polyethylene glycol powder (GLYCOLAX/MIRALAX) powder      Patient Active Problem List   Diagnosis Date Noted  . Supervision of normal first pregnancy in first trimester 02/17/2016    Objective:     BP 113/73 mmHg  Pulse 98  Wt 154 lb (69.854 kg) Uterine Size: Below umbilicus   FHR: 150 by doppler  Assessment:    Pregnancy @ 7053w5d  weeks Constipation     Plan:    Problem list reviewed and updated. Labs reviewed.  Follow up in 4 weeks. FIRST/CF mutation testing/NIPT/QUAD SCREEN/fragile X/Ashkenazi Jewish population testing/Spinal muscular atrophy discussed: ordered. Role of ultrasound in pregnancy discussed; fetal survey: ordered. Amniocentesis discussed: not indicated. 50% of 15 minute visit spent on counseling and coordination of care.

## 2016-04-21 NOTE — Progress Notes (Signed)
Pt is having some problems with constipation. Has taken stool softeners with some relief.

## 2016-04-28 ENCOUNTER — Other Ambulatory Visit: Payer: Self-pay | Admitting: Certified Nurse Midwife

## 2016-04-28 LAB — MATERNIT21 PLUS CORE+SCA
Chromosome 13: NEGATIVE
Chromosome 18: NEGATIVE
Chromosome 21: NEGATIVE
PDF: 0
Y Chromosome: NOT DETECTED

## 2016-05-13 ENCOUNTER — Other Ambulatory Visit: Payer: Self-pay | Admitting: Certified Nurse Midwife

## 2016-05-19 ENCOUNTER — Other Ambulatory Visit: Payer: Self-pay | Admitting: Certified Nurse Midwife

## 2016-05-19 ENCOUNTER — Ambulatory Visit (INDEPENDENT_AMBULATORY_CARE_PROVIDER_SITE_OTHER): Payer: Medicaid Other

## 2016-05-19 ENCOUNTER — Ambulatory Visit (INDEPENDENT_AMBULATORY_CARE_PROVIDER_SITE_OTHER): Payer: Medicaid Other | Admitting: Certified Nurse Midwife

## 2016-05-19 VITALS — BP 100/67 | HR 75 | Temp 99.0°F | Wt 164.0 lb

## 2016-05-19 DIAGNOSIS — O26872 Cervical shortening, second trimester: Secondary | ICD-10-CM

## 2016-05-19 DIAGNOSIS — Z3402 Encounter for supervision of normal first pregnancy, second trimester: Secondary | ICD-10-CM

## 2016-05-19 DIAGNOSIS — Z36 Encounter for antenatal screening of mother: Secondary | ICD-10-CM | POA: Diagnosis not present

## 2016-05-19 LAB — POCT URINALYSIS DIPSTICK
Bilirubin, UA: NEGATIVE
Blood, UA: NEGATIVE
Glucose, UA: NEGATIVE
Ketones, UA: NEGATIVE
Leukocytes, UA: NEGATIVE
Nitrite, UA: NEGATIVE
Protein, UA: NEGATIVE
Spec Grav, UA: <=1.005
Urobilinogen, UA: NEGATIVE
pH, UA: 7.5

## 2016-05-19 MED ORDER — PROGESTERONE MICRONIZED 200 MG PO CAPS: 200.0000 mg | ORAL_CAPSULE | Freq: Every day | ORAL | Status: DC

## 2016-05-19 NOTE — Progress Notes (Signed)
Subjective:    Kristin Stokes is a 20 y.o. female being seen today for her obstetrical visit. She is at 247w5d gestation. Patient reports: no complaints . Fetal movement: normal.  FMLA paperwork submitted.   Problem List Items Addressed This Visit    None    Visit Diagnoses    Encounter for supervision of normal first pregnancy in second trimester    -  Primary    Relevant Orders    POCT urinalysis dipstick (Completed)      Patient Active Problem List   Diagnosis Date Noted  . Supervision of normal first pregnancy in first trimester 02/17/2016   Objective:    BP 100/67 mmHg  Pulse 75  Temp(Src) 99 F (37.2 C)  Wt 164 lb (74.39 kg) FHT: 145 BPM  Uterine Size: size equals dates     Assessment:    Pregnancy @ 5747w5d    Doing well  Plan:    OBGCT: discussed. Signs and symptoms of preterm labor: discussed. FMLA paperwork completed.  Anatomy ultrasound this AM in office completed  Labs, problem list reviewed and updated 2 hr GTT planned Follow up in 4 weeks.

## 2016-05-20 ENCOUNTER — Other Ambulatory Visit: Payer: Self-pay | Admitting: Certified Nurse Midwife

## 2016-05-20 DIAGNOSIS — O26872 Cervical shortening, second trimester: Secondary | ICD-10-CM

## 2016-05-20 NOTE — Progress Notes (Unsigned)
Spoke with patient regarding shortened cervix via US.  Instructions given for vaginal progesterone suppositories.  Patient aware of US on Monday morning @945 .  Patient understands precautions: no sexual intercourse and nothing vaginally until further notice except for progesterone.  Possible treatment options discussed. Patient verbalizes understanding.  Discussed possible work restrictions coming shortly.  Is currently working at [employer]: on her feet but not lifting and able to take frequent breaks. Denies any LOF, vaginal bleeding, cramping or contractions.   R.Umer Harig CNM

## 2016-05-23 ENCOUNTER — Encounter (HOSPITAL_COMMUNITY): Payer: Self-pay

## 2016-05-23 ENCOUNTER — Other Ambulatory Visit: Payer: Self-pay | Admitting: Certified Nurse Midwife

## 2016-05-23 ENCOUNTER — Ambulatory Visit (HOSPITAL_COMMUNITY)
Admission: RE | Admit: 2016-05-23 | Discharge: 2016-05-23 | Disposition: A | Discharge status: Home / Self Care | Payer: BLUE CROSS/BLUE SHIELD | Source: Ambulatory Visit | Attending: Certified Nurse Midwife | Admitting: Certified Nurse Midwife

## 2016-05-23 DIAGNOSIS — Z3A20 20 weeks gestation of pregnancy: Secondary | ICD-10-CM | POA: Diagnosis not present

## 2016-05-23 DIAGNOSIS — Z36 Encounter for antenatal screening of mother: Secondary | ICD-10-CM | POA: Insufficient documentation

## 2016-05-23 DIAGNOSIS — O26872 Cervical shortening, second trimester: Secondary | ICD-10-CM

## 2016-05-23 DIAGNOSIS — Z3402 Encounter for supervision of normal first pregnancy, second trimester: Secondary | ICD-10-CM

## 2016-05-24 ENCOUNTER — Other Ambulatory Visit: Payer: Self-pay | Admitting: Certified Nurse Midwife

## 2016-05-24 ENCOUNTER — Other Ambulatory Visit (HOSPITAL_COMMUNITY): Payer: Self-pay | Admitting: *Deleted

## 2016-05-24 DIAGNOSIS — O26879 Cervical shortening, unspecified trimester: Secondary | ICD-10-CM

## 2016-06-06 ENCOUNTER — Ambulatory Visit (HOSPITAL_COMMUNITY)
Admission: RE | Admit: 2016-06-06 | Discharge: 2016-06-06 | Disposition: A | Discharge status: Home / Self Care | Payer: BLUE CROSS/BLUE SHIELD | Source: Ambulatory Visit | Attending: Certified Nurse Midwife | Admitting: Certified Nurse Midwife

## 2016-06-06 ENCOUNTER — Encounter (HOSPITAL_COMMUNITY): Payer: Self-pay

## 2016-06-06 DIAGNOSIS — O26879 Cervical shortening, unspecified trimester: Secondary | ICD-10-CM

## 2016-06-06 DIAGNOSIS — Z3A22 22 weeks gestation of pregnancy: Secondary | ICD-10-CM | POA: Insufficient documentation

## 2016-06-06 DIAGNOSIS — O26872 Cervical shortening, second trimester: Secondary | ICD-10-CM | POA: Diagnosis present

## 2016-06-07 ENCOUNTER — Other Ambulatory Visit (HOSPITAL_COMMUNITY): Payer: Self-pay | Admitting: *Deleted

## 2016-06-07 DIAGNOSIS — O26879 Cervical shortening, unspecified trimester: Secondary | ICD-10-CM

## 2016-06-16 ENCOUNTER — Ambulatory Visit (INDEPENDENT_AMBULATORY_CARE_PROVIDER_SITE_OTHER): Payer: Medicaid Other | Admitting: Certified Nurse Midwife

## 2016-06-16 VITALS — BP 111/74 | HR 73 | Temp 98.8°F | Wt 166.3 lb

## 2016-06-16 DIAGNOSIS — Z3402 Encounter for supervision of normal first pregnancy, second trimester: Secondary | ICD-10-CM

## 2016-06-16 LAB — POCT URINALYSIS DIPSTICK
Bilirubin, UA: NEGATIVE
Blood, UA: NEGATIVE
Glucose, UA: NEGATIVE
Ketones, UA: NEGATIVE
Leukocytes, UA: NEGATIVE
Nitrite, UA: NEGATIVE
Protein, UA: NEGATIVE
Spec Grav, UA: 1.01
Urobilinogen, UA: NEGATIVE
pH, UA: 7

## 2016-06-16 NOTE — Progress Notes (Signed)
Pt states that she may be having a reaction to mosquito bites occurred recently.

## 2016-06-16 NOTE — Progress Notes (Signed)
Subjective:    Kristin Stokes is a 20 y.o. female being seen today for her obstetrical visit. She is at 649w5d gestation. Patient reports: no bleeding, no contractions, no cramping, no leaking and mosquito bites are very itchy .  Reports getting mosquito bites on Sunday, June 16th.  Reports taking Benadryl when not at work and using hydrocortisone cream, with minimal relief. Fetal movement: normal.  Problem List Items Addressed This Visit    None    Visit Diagnoses    Encounter for supervision of normal first pregnancy in second trimester    -  Primary    Relevant Orders    POCT urinalysis dipstick (Completed)      Patient Active Problem List   Diagnosis Date Noted  . Supervision of normal first pregnancy in first trimester 02/17/2016   Objective:    BP 111/74 mmHg  Pulse 73  Temp(Src) 98.8 F (37.1 C)  Wt 166 lb 4.8 oz (75.433 kg) FHT: 155 BPM  Uterine Size: size equals dates     Assessment:    Pregnancy @ 1449w5d   Doing well. Plan:    OBGCT: ordered for next visit. Signs and symptoms of preterm labor: discussed. Smoking cessation discussed never smoked. Appt with MFM on Monday, June 24th for cerivical length check. Labs, problem list reviewed and updated 2 hr GTT planned Follow up in 3 weeks.

## 2016-06-16 NOTE — Progress Notes (Signed)
I agree with note by NP Student Andrew Brake.  Was present for exam.  R.Ebrahim Deremer CNM 

## 2016-06-20 ENCOUNTER — Encounter (HOSPITAL_COMMUNITY): Payer: Self-pay

## 2016-06-20 ENCOUNTER — Ambulatory Visit (HOSPITAL_COMMUNITY)
Admission: RE | Admit: 2016-06-20 | Discharge: 2016-06-20 | Disposition: A | Discharge status: Home / Self Care | Payer: BLUE CROSS/BLUE SHIELD | Source: Ambulatory Visit | Attending: Certified Nurse Midwife | Admitting: Certified Nurse Midwife

## 2016-06-20 DIAGNOSIS — Z3A24 24 weeks gestation of pregnancy: Secondary | ICD-10-CM | POA: Insufficient documentation

## 2016-06-20 DIAGNOSIS — O26879 Cervical shortening, unspecified trimester: Secondary | ICD-10-CM

## 2016-06-20 DIAGNOSIS — O26872 Cervical shortening, second trimester: Secondary | ICD-10-CM | POA: Diagnosis present

## 2016-07-07 ENCOUNTER — Other Ambulatory Visit: Payer: Medicaid Other

## 2016-07-07 ENCOUNTER — Ambulatory Visit (INDEPENDENT_AMBULATORY_CARE_PROVIDER_SITE_OTHER): Payer: Medicaid Other | Admitting: Certified Nurse Midwife

## 2016-07-07 VITALS — BP 106/71 | HR 99 | Temp 98.3°F | Wt 168.8 lb

## 2016-07-07 DIAGNOSIS — Z3403 Encounter for supervision of normal first pregnancy, third trimester: Secondary | ICD-10-CM

## 2016-07-07 DIAGNOSIS — Z3402 Encounter for supervision of normal first pregnancy, second trimester: Secondary | ICD-10-CM

## 2016-07-07 LAB — POCT URINALYSIS DIPSTICK
Bilirubin, UA: NEGATIVE
Blood, UA: NEGATIVE
Glucose, UA: NEGATIVE
Ketones, UA: NEGATIVE
Leukocytes, UA: NEGATIVE
Nitrite, UA: NEGATIVE
Spec Grav, UA: <=1.005
Urobilinogen, UA: 0.2
pH, UA: >=9

## 2016-07-07 NOTE — Progress Notes (Signed)
Pt denies concerns at this time. 

## 2016-07-07 NOTE — Progress Notes (Addendum)
Subjective:    Kristin InchesJazzmyne Stokes is a 20 y.o. female being seen today for her obstetrical visit. She is at 2458w5d gestation. Patient reports: no complaints . Fetal movement: normal.  Problem List Items Addressed This Visit      Other   Supervision of normal first pregnancy in third trimester     Clinic  [facility]  Dating  Blood type: O/Positive/-- (03/22 1348)   Genetic Screen 1 Screen:    AFP:     Quad:     NIPS: Antibody:Negative (03/22 1348)  Anatomic US  Rubella: 2.47 (03/22 1348)  GTT Early:               Third trimester:  RPR: Non Reactive (03/22 1348)   Flu vaccine  HBsAg: Negative (03/22 1348)   TDaP vaccine                                               Rhogam: HIV: Non Reactive (03/22 1348)   Baby Food        breast                                       GBS: (For PCN allergy, check sensitivities)  Contraception  POP Pap:  Circumcision  N/A   Pediatrician    Support Person            Other Visit Diagnoses    Encounter for supervision of normal first pregnancy in second trimester    -  Primary   Relevant Orders   POCT Urinalysis Dipstick (Completed)   Glucose Tolerance, 2 Hours w/1 Hour   CBC   HIV antibody   RPR     Patient Active Problem List   Diagnosis Date Noted  . Supervision of normal first pregnancy in third trimester 02/17/2016   Objective:    BP 106/71   Pulse 99   Temp 98.3 F (36.8 C)   Wt 168 lb 12.8 oz (76.6 kg)   BMI 24.93 kg/m  FHT: 152 BPM  Uterine Size: 26 cm and size equals dates     Assessment:    Pregnancy @ 3758w5d    Plan:   Discussed pediatrician:  List given to patient  [employer] form completed   OBGCT: ordered. Signs and symptoms of preterm labor: discussed.  Labs, problem list reviewed and updated 2 hr GTT planned Follow up in 2 weeks.

## 2016-07-07 NOTE — Assessment & Plan Note (Signed)
  Clinic  [facility]  Dating  Blood type: O/Positive/-- (03/22 1348)   Genetic Screen 1 Screen:    AFP:     Quad:     NIPS: Antibody:Negative (03/22 1348)  Anatomic US  Rubella: 2.47 (03/22 1348)  GTT Early:               Third trimester:  RPR: Non Reactive (03/22 1348)   Flu vaccine  HBsAg: Negative (03/22 1348)   TDaP vaccine                                               Rhogam: HIV: Non Reactive (03/22 1348)   Baby Food        breast                                       GBS: (For PCN allergy, check sensitivities)  Contraception  POP Pap:  Circumcision  N/A   Pediatrician    Support Person

## 2016-07-08 LAB — CBC
Hematocrit: 38 % (ref 34.0–46.6)
Hemoglobin: 12.7 g/dL (ref 11.1–15.9)
MCH: 32 pg (ref 26.6–33.0)
MCHC: 33.4 g/dL (ref 31.5–35.7)
MCV: 96 fL (ref 79–97)
Platelets: 260 10*3/uL (ref 150–379)
RBC: 3.97 x10E6/uL (ref 3.77–5.28)
RDW: 13.8 % (ref 12.3–15.4)
WBC: 8.1 10*3/uL (ref 3.4–10.8)

## 2016-07-08 LAB — GLUCOSE TOLERANCE, 2 HOURS W/ 1HR
Glucose, 1 hour: 113 mg/dL (ref 65–179)
Glucose, 2 hour: 96 mg/dL (ref 65–152)
Glucose, Fasting: 77 mg/dL (ref 65–91)

## 2016-07-08 LAB — HIV ANTIBODY (ROUTINE TESTING W REFLEX): HIV Screen 4th Generation wRfx: NONREACTIVE

## 2016-07-08 LAB — RPR: RPR Ser Ql: NONREACTIVE

## 2016-07-21 ENCOUNTER — Encounter: Payer: Medicaid Other | Admitting: Certified Nurse Midwife

## 2016-07-22 ENCOUNTER — Ambulatory Visit (INDEPENDENT_AMBULATORY_CARE_PROVIDER_SITE_OTHER): Payer: Medicaid Other | Admitting: Certified Nurse Midwife

## 2016-07-22 VITALS — BP 111/75 | HR 117 | Temp 98.4°F | Wt 173.8 lb

## 2016-07-22 DIAGNOSIS — Z3402 Encounter for supervision of normal first pregnancy, second trimester: Secondary | ICD-10-CM

## 2016-07-22 LAB — POCT URINALYSIS DIPSTICK
Bilirubin, UA: NEGATIVE
Blood, UA: NEGATIVE
Glucose, UA: NEGATIVE
Leukocytes, UA: NEGATIVE
Nitrite, UA: NEGATIVE
Spec Grav, UA: 1.025
Urobilinogen, UA: 0.2
pH, UA: 5

## 2016-07-22 NOTE — Progress Notes (Signed)
Subjective:    Phineas InchesJazzmyne Galbreath is a 20 y.o. female being seen today for her obstetrical visit. She is at 9067w6d gestation. Patient reports no complaints. Fetal movement: normal.  Problem List Items Addressed This Visit    None    Visit Diagnoses    Encounter for supervision of normal first pregnancy in second trimester    -  Primary   Relevant Orders   POCT Urinalysis Dipstick (Completed)     Patient Active Problem List   Diagnosis Date Noted  . Supervision of normal first pregnancy in third trimester 02/17/2016   Objective:    BP 111/75   Pulse (!) 117   Temp 98.4 F (36.9 C)   Wt 173 lb 12.8 oz (78.8 kg)   BMI 25.67 kg/m  FHT:  148 BPM  Uterine Size: 28 cm and size equals dates  Presentation: cephalic     Assessment:    Pregnancy @ 3167w6d weeks   Doing well  Plan:     labs reviewed, problem list updated Consent signed. GBS planning TDAP offered  Rhogam given for RH negative Pediatrician: discussed. Infant feeding: plans to breastfeed. Maternity leave: discussed. Cigarette smoking: never smoked. Orders Placed This Encounter  Procedures  . POCT Urinalysis Dipstick   No orders of the defined types were placed in this encounter.  Follow up in 2 Weeks.

## 2016-08-05 ENCOUNTER — Ambulatory Visit (INDEPENDENT_AMBULATORY_CARE_PROVIDER_SITE_OTHER): Payer: Medicaid Other | Admitting: Certified Nurse Midwife

## 2016-08-05 DIAGNOSIS — Z3403 Encounter for supervision of normal first pregnancy, third trimester: Secondary | ICD-10-CM

## 2016-08-05 NOTE — Progress Notes (Signed)
Pt states that she is now having back pain.

## 2016-08-05 NOTE — Progress Notes (Signed)
Subjective:    Kristin Stokes is a 20 y.o. female being seen today for her obstetrical visit. She is at 5470w6d gestation. Patient reports backache, no bleeding, no contractions, no cramping and no leaking. Fetal movement: normal.  Problem List Items Addressed This Visit      Other   Supervision of normal first pregnancy in third trimester    Other Visit Diagnoses   None.    Patient Active Problem List   Diagnosis Date Noted  . Supervision of normal first pregnancy in third trimester 02/17/2016   Objective:    BP 107/72   Pulse (!) 102   Wt 178 lb (80.7 kg)   BMI 26.29 kg/m  FHT:  137 BPM  Uterine Size: 29 cm and size equals dates  Presentation: cephalic     Assessment:    Pregnancy @ 7570w6d weeks   Doing well  Lumbar back pain of pregnancy  Plan:     labs reviewed, problem list updated Consent signed. GBS planning TDAP offered  Rhogam given for RH negative Pediatrician: discussed. Infant feeding: plans to breastfeed. Maternity leave: discussed. Cigarette smoking: never smoked. No orders of the defined types were placed in this encounter.  No orders of the defined types were placed in this encounter.  Follow up in 2 Weeks.

## 2016-08-16 ENCOUNTER — Ambulatory Visit (INDEPENDENT_AMBULATORY_CARE_PROVIDER_SITE_OTHER): Payer: Medicaid Other | Admitting: Obstetrics and Gynecology

## 2016-08-16 VITALS — BP 107/71 | HR 96 | Temp 98.4°F | Wt 176.4 lb

## 2016-08-16 DIAGNOSIS — Z3403 Encounter for supervision of normal first pregnancy, third trimester: Secondary | ICD-10-CM | POA: Diagnosis not present

## 2016-08-16 DIAGNOSIS — O26879 Cervical shortening, unspecified trimester: Secondary | ICD-10-CM | POA: Insufficient documentation

## 2016-08-16 MED ORDER — PRENATAL VITAMINS 0.8 MG PO TABS: 1.0000 | ORAL_TABLET | Freq: Every day | ORAL | 12 refills | Status: DC

## 2016-08-16 NOTE — Progress Notes (Signed)
Subjective:  Kristin Stokes is a 20 y.o. G1P0 at 174w3d being seen today for ongoing prenatal care.  She is currently monitored for the following issues for this high-risk pregnancy and has Supervision of normal first pregnancy in third trimester and Cervical shortening affecting pregnancy on her problem list.  Patient reports backache.  Contractions: Not present. Vag. Bleeding: None.  Movement: Present. Denies leaking of fluid.   The following portions of the patient's history were reviewed and updated as appropriate: allergies, current medications, past family history, past medical history, past social history, past surgical history and problem list. Problem list updated.  Objective:   Vitals:   08/16/16 1127  BP: 107/71  Pulse: 96  Temp: 98.4 F (36.9 C)  Weight: 176 lb 6.4 oz (80 kg)    Fetal Status:     Movement: Present     General:  Alert, oriented and cooperative. Patient is in no acute distress.  Skin: Skin is warm and dry. No rash noted.   Cardiovascular: Normal heart rate noted  Respiratory: Normal respiratory effort, no problems with respiration noted  Abdomen: Soft, gravid, appropriate for gestational age. Pain/Pressure: Absent     Pelvic:  Cervical exam deferred        Extremities: Normal range of motion.  Edema: None  Mental Status: Normal mood and affect. Normal behavior. Normal judgment and thought content.   Urinalysis: Urine Protein: Negative Urine Glucose: Negative  Assessment and Plan:  Pregnancy: G1P0 at 624w3d  1. Supervision of normal first pregnancy in third trimester   2. Cervical shortening affecting pregnancy Continue with progesterone supplementation  Preterm labor symptoms and general obstetric precautions including but not limited to vaginal bleeding, contractions, leaking of fluid and fetal movement were reviewed in detail with the patient. Please refer to After Visit Summary for other counseling recommendations.  Return in about 2 weeks  (around 08/30/2016) for OB visit.   Hermina StaggersMichael L Lucerito Rosinski, MD

## 2016-08-16 NOTE — Progress Notes (Signed)
Patient states that overall she feels good.

## 2016-09-01 ENCOUNTER — Encounter: Payer: Self-pay | Admitting: Obstetrics and Gynecology

## 2016-09-05 ENCOUNTER — Telehealth: Payer: Self-pay

## 2016-09-05 NOTE — Telephone Encounter (Signed)
Returned call and s/w patient who stated that she thinks her mucous plug fell out on Saturday 09-03-16. Patient reports not feeling contractions, no pressure and no bleeding. Advised to go to [facility] if these things change.

## 2016-09-12 ENCOUNTER — Telehealth: Payer: Self-pay | Admitting: *Deleted

## 2016-09-12 NOTE — Telephone Encounter (Signed)
Patient states she lost more mucus plug and she is unsure about when to go to the hospital. Discussed signs of labor and when to go to hospital.

## 2016-09-13 ENCOUNTER — Ambulatory Visit (INDEPENDENT_AMBULATORY_CARE_PROVIDER_SITE_OTHER): Payer: BLUE CROSS/BLUE SHIELD | Admitting: Obstetrics and Gynecology

## 2016-09-13 VITALS — BP 110/74 | HR 93 | Temp 98.2°F | Wt 188.0 lb

## 2016-09-13 DIAGNOSIS — Z3403 Encounter for supervision of normal first pregnancy, third trimester: Secondary | ICD-10-CM

## 2016-09-13 DIAGNOSIS — O26879 Cervical shortening, unspecified trimester: Secondary | ICD-10-CM

## 2016-09-13 DIAGNOSIS — Z113 Encounter for screening for infections with a predominantly sexual mode of transmission: Secondary | ICD-10-CM

## 2016-09-13 LAB — OB RESULTS CONSOLE GC/CHLAMYDIA: Gonorrhea: NEGATIVE

## 2016-09-13 LAB — OB RESULTS CONSOLE GBS: GBS: POSITIVE

## 2016-09-13 MED ORDER — PRENATAL MULTIVITAMIN CH: 1.0000 | ORAL_TABLET | Freq: Every day | ORAL | 1 refills | Status: DC

## 2016-09-13 MED ORDER — VALACYCLOVIR HCL 1 G PO TABS: 1000.0000 mg | ORAL_TABLET | Freq: Every day | ORAL | 1 refills | Status: DC

## 2016-09-13 NOTE — Progress Notes (Signed)
   PRENATAL VISIT NOTE  Subjective:  Kristin Stokes is a 20 y.o. G1P0 at 6635w3d being seen today for ongoing prenatal care.  She is currently monitored for the following issues for this high-risk pregnancy and has Supervision of normal first pregnancy in third trimester and Cervical shortening affecting pregnancy on her problem list.  Patient reports no complaints.  Contractions: Irregular. Vag. Bleeding: None.  Movement: Present. Denies leaking of fluid.   The following portions of the patient's history were reviewed and updated as appropriate: allergies, current medications, past family history, past medical history, past social history, past surgical history and problem list. Problem list updated.  Objective:   Vitals:   09/13/16 1608  BP: 110/74  Pulse: 93  Temp: 98.2 F (36.8 C)  Weight: 188 lb (85.3 kg)    Fetal Status: Fetal Heart Rate (bpm): 146 Fundal Height: 36 cm Movement: Present  Presentation: Vertex  General:  Alert, oriented and cooperative. Patient is in no acute distress.  Skin: Skin is warm and dry. No rash noted.   Cardiovascular: Normal heart rate noted  Respiratory: Normal respiratory effort, no problems with respiration noted  Abdomen: Soft, gravid, appropriate for gestational age. Pain/Pressure: Absent     Pelvic:  Cervical exam performed Dilation: Fingertip Effacement (%): 50 Station: -3  Extremities: Normal range of motion.  Edema: Trace  Mental Status: Normal mood and affect. Normal behavior. Normal judgment and thought content.   Assessment and Plan:  Pregnancy: G1P0 at 2535w3d  1. Supervision of normal first pregnancy in third trimester Patient is doing well without complaints She plans on having a water birth and is taking the class tomorrow. Information on waterbirth provided Cultures collected today - Culture, beta strep (group b only) - GC/Chlamydia probe amp ([place])not at [facility]  2. Cervical shortening affecting pregnancy Continue  prometrium until 37 weeks  3. 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 Please refer to After Visit Summary for other counseling recommendations.  Return in about 1 week (around 09/20/2016).  Catalina AntiguaPeggy Jamyra Zweig, MD

## 2016-09-13 NOTE — Patient Instructions (Signed)
Thinking About Waterbirth???  You must attend a Waterbirth class at [facility]  3rd Wednesday of every month from 7-9pm  Free  Register by calling [phone] or online at [email or web address]  Bring us the certificate from the class  Waterbirth supplies needed for [facility] patients:  Our practice has a Birth Pool in a Box tub at the hospital that you can borrow  You will need to purchase an accessory kit that has all needed supplies through [facility] ([phone]) or online $175.00  Or you can purchase the supplies separately: o Single-use disposable tub liner for Birth Pool in a Box (REGULAR size) o New garden hose labeled "lead-free", "suitable for drinking water", o Electric drain pump to remove water (We recommend 792 gallon per hour or greater pump.)  o  "non-toxic" OR "water potable" o Garden hose to remove the dirty water o Fish net o Bathing suit top (optional) o Long-handled mirror (optional)  [employer] sells tubs for ~ $120 if you would rather purchase your own tub.  They also sell accessories, liners.    Www.waterbirthsolutions.com for tub purchases and supplies  [employer] (www.thelaborladies.com) $275 for tub rental/set-up & take down/kit   [place] [employer] information regarding doulas (labor support) who provide pool rentals:  Http://www.padanc.org/MeetUs.htm   [employer] (www.thelaborladies.com)  Http://www.padanc.org/MeetUs.htm   Things that would prevent you from having a waterbirth:  Premature, <37wks  Previous cesarean birth  Presence of thick meconium-stained fluid  Multiple gestation (Twins, triplets, etc.)  Uncontrolled diabetes or gestational diabetes requiring medication  Hypertension  Heavy vaginal bleeding  Non-reassuring fetal heart rate  Active infection (MRSA, etc.)  If your labor has to be induced and induction  method requires continuous monitoring of the baby's heart rate  Other risks/issues identified by your obstetrical provider  

## 2016-09-13 NOTE — Progress Notes (Signed)
Patient states that she started losing her mucous plug last Saturday and has had occasional contractions, reports good fetal movement.

## 2016-09-15 LAB — GC/CHLAMYDIA PROBE AMP (~~LOC~~) NOT AT ARMC
Chlamydia: NEGATIVE
Neisseria Gonorrhea: NEGATIVE

## 2016-09-16 LAB — CULTURE, BETA STREP (GROUP B ONLY): Strep Gp B Culture: POSITIVE — AB

## 2016-09-22 ENCOUNTER — Encounter: Payer: Self-pay | Admitting: Obstetrics and Gynecology

## 2016-09-22 ENCOUNTER — Ambulatory Visit (INDEPENDENT_AMBULATORY_CARE_PROVIDER_SITE_OTHER): Payer: BLUE CROSS/BLUE SHIELD | Admitting: Obstetrics and Gynecology

## 2016-09-22 VITALS — BP 121/83 | HR 123 | Temp 97.4°F | Wt 189.2 lb

## 2016-09-22 DIAGNOSIS — Z8619 Personal history of other infectious and parasitic diseases: Secondary | ICD-10-CM

## 2016-09-22 DIAGNOSIS — O26879 Cervical shortening, unspecified trimester: Secondary | ICD-10-CM | POA: Diagnosis not present

## 2016-09-22 DIAGNOSIS — Z3403 Encounter for supervision of normal first pregnancy, third trimester: Secondary | ICD-10-CM

## 2016-09-22 DIAGNOSIS — A6000 Herpesviral infection of urogenital system, unspecified: Secondary | ICD-10-CM | POA: Insufficient documentation

## 2016-09-22 NOTE — Progress Notes (Signed)
Subjective:  Phineas InchesJazzmyne Galbreath is a 20 y.o. G1P0 at 4252w5d being seen today for ongoing prenatal care.  She is currently monitored for the following issues for this high-risk pregnancy and has Supervision of normal first pregnancy in third trimester; Cervical shortening affecting pregnancy; and History of ELISA positive for HSV on her problem list.  Patient reports no complaints.  Contractions: Irregular. Vag. Bleeding: None.  Movement: Present. Denies leaking of fluid.   The following portions of the patient's history were reviewed and updated as appropriate: allergies, current medications, past family history, past medical history, past social history, past surgical history and problem list. Problem list updated.  Objective:   Vitals:   09/22/16 1458  BP: 121/83  Pulse: (!) 123  Temp: 97.4 F (36.3 C)  Weight: 189 lb 3.2 oz (85.8 kg)    Fetal Status: Fetal Heart Rate (bpm): 140 Fundal Height: 38 cm Movement: Present     General:  Alert, oriented and cooperative. Patient is in no acute distress.  Skin: Skin is warm and dry. No rash noted.   Cardiovascular: Normal heart rate noted  Respiratory: Normal respiratory effort, no problems with respiration noted  Abdomen: Soft, gravid, appropriate for gestational age. Pain/Pressure: Absent     Pelvic:  Cervical exam deferred        Extremities: Normal range of motion.  Edema: Trace  Mental Status: Normal mood and affect. Normal behavior. Normal judgment and thought content.   Urinalysis:      Assessment and Plan:  Pregnancy: G1P0 at 1352w5d  1. Supervision of normal first pregnancy in third trimester   2. Cervical shortening affecting pregnancy Has completed Prometrium  3. History of ELISA positive for HSV Continue with suprresion  Term labor symptoms and general obstetric precautions including but not limited to vaginal bleeding, contractions, leaking of fluid and fetal movement were reviewed in detail with the patient. Please  refer to After Visit Summary for other counseling recommendations.  No Follow-up on file.   Hermina StaggersMichael L Shuntay Everetts, MD

## 2016-09-22 NOTE — Patient Instructions (Signed)
Vaginal Delivery °During delivery, your health care provider will help you give birth to your baby. During a vaginal delivery, you will work to push the baby out of your vagina. However, before you can push your baby out, a few things need to happen. The opening of your uterus (cervix) has to soften, thin out, and open up (dilate) all the way to 10 cm. Also, your baby has to move down from the uterus into your vagina.  °SIGNS OF LABOR  °Your health care provider will first need to make sure you are in labor. Signs of labor include:  °· Passing what is called the mucous plug before labor begins. This is a small amount of blood-stained mucus. °· Having regular, painful uterine contractions.   °· The time between contractions gets shorter.   °· The discomfort and pain gradually get more intense. °· Contraction pains get worse when walking and do not go away when resting.   °· Your cervix becomes thinner (effacement) and dilates. °BEFORE THE DELIVERY °Once you are in labor and admitted into the hospital or care center, your health care provider may do the following:  °· Perform a complete physical exam. °· Review any complications related to pregnancy or labor.  °· Check your blood pressure, pulse, temperature, and heart rate (vital signs).   °· Determine if, and when, the rupture of amniotic membranes occurred. °· Do a vaginal exam (using a sterile glove and lubricant) to determine:   °¨ The position (presentation) of the baby. Is the baby's head presenting first (vertex) in the birth canal (vagina), or are the feet or buttocks first (breech)?   °¨ The level (station) of the baby's head within the birth canal.   °¨ The effacement and dilatation of the cervix.   °· An electronic fetal monitor is usually placed on your abdomen when you first arrive. This is used to monitor your contractions and the baby's heart rate. °¨ When the monitor is on your abdomen (external fetal monitor), it can only pick up the frequency and  length of your contractions. It cannot tell the strength of your contractions. °¨ If it becomes necessary for your health care provider to know exactly how strong your contractions are or to see exactly what the baby's heart rate is doing, an internal monitor may be inserted into your vagina and uterus. Your health care provider will discuss the benefits and risks of using an internal monitor and obtain your permission before inserting the device. °¨ Continuous fetal monitoring may be needed if you have an epidural, are receiving certain medicines (such as oxytocin), or have pregnancy or labor complications. °· An IV access tube may be placed into a vein in your arm to deliver fluids and medicines if necessary. °THREE STAGES OF LABOR AND DELIVERY °Normal labor and delivery is divided into three stages. °First Stage °This stage starts when you begin to contract regularly and your cervix begins to efface and dilate. It ends when your cervix is completely open (fully dilated). The first stage is the longest stage of labor and can last from 3 hours to 15 hours.  °Several methods are available to help with labor pain. You and your health care provider will decide which option is best for you. Options include:  °· Opioid medicines. These are strong pain medicines that you can get through your IV tube or as a shot into your muscle. These medicines lessen pain but do not make it go away completely.  °· Epidural. A medicine is given through a thin tube that   is inserted in your back. The medicine numbs the lower part of your body and prevents any pain in that area. °· Paracervical pain medicine. This is an injection of an anesthetic on each side of your cervix.   °· You may request natural childbirth, which does not involve the use of pain medicines or an epidural during labor and delivery. Instead, you will use other things, such as breathing exercises, to help cope with the pain. °Second Stage °The second stage of labor  begins when your cervix is fully dilated at 10 cm. It continues until you push your baby down through the birth canal and the baby is born. This stage can take only minutes or several hours. °· The location of your baby's head as it moves through the birth canal is reported as a number called a station. If the baby's head has not started its descent, the station is described as being at minus 3 (-3). When your baby's head is at the zero station, it is at the middle of the birth canal and is engaged in the pelvis. The station of your baby helps indicate the progress of the second stage of labor. °· When your baby is born, your health care provider may hold the baby with his or her head lowered to prevent amniotic fluid, mucus, and blood from getting into the baby's lungs. The baby's mouth and nose may be suctioned with a small bulb syringe to remove any additional fluid. °· Your health care provider may then place the baby on your stomach. It is important to keep the baby from getting cold. To do this, the health care provider will dry the baby off, place the baby directly on your skin (with no blankets between you and the baby), and cover the baby with warm, dry blankets.   °· The umbilical cord is cut. °Third Stage °During the third stage of labor, your health care provider will deliver the placenta (afterbirth) and make sure your bleeding is under control. The delivery of the placenta usually takes about 5 minutes but can take up to 30 minutes. After the placenta is delivered, a medicine may be given either by IV or injection to help contract the uterus and control bleeding. If you are planning to breastfeed, you can try to do so now. °After you deliver the placenta, your uterus should contract and get very firm. If your uterus does not remain firm, your health care provider will massage it. This is important because the contraction of the uterus helps cut off bleeding at the site where the placenta was attached  to your uterus. If your uterus does not contract properly and stay firm, you may continue to bleed heavily. If there is a lot of bleeding, medicines may be given to contract the uterus and stop the bleeding.  °  °This information is not intended to replace advice given to you by your health care provider. Make sure you discuss any questions you have with your health care provider. °  °Document Released: 08/23/2008 Document Revised: 12/05/2014 Document Reviewed: 07/11/2012 °Elsevier Interactive Patient Education ©2016 [employer] ° °

## 2016-09-22 NOTE — Progress Notes (Signed)
Patient is in the office, reports irregular contractions and good fetal movement. 

## 2016-09-29 ENCOUNTER — Encounter: Payer: BLUE CROSS/BLUE SHIELD | Admitting: Obstetrics & Gynecology

## 2016-09-30 ENCOUNTER — Ambulatory Visit (INDEPENDENT_AMBULATORY_CARE_PROVIDER_SITE_OTHER): Payer: BLUE CROSS/BLUE SHIELD | Admitting: Obstetrics & Gynecology

## 2016-09-30 DIAGNOSIS — Z3403 Encounter for supervision of normal first pregnancy, third trimester: Secondary | ICD-10-CM

## 2016-09-30 NOTE — Progress Notes (Signed)
Patient states that she felt pressure last night for about 2 hours, denies contractions and bleeding, reports good fetal movement.

## 2016-09-30 NOTE — Patient Instructions (Signed)
Return to clinic for any scheduled appointments or obstetric concerns, or go to [facility] for evaluation  

## 2016-09-30 NOTE — Progress Notes (Signed)
   PRENATAL VISIT NOTE  Subjective:  Kristin Stokes is a 20 y.o. G1P0 at 4460w6d being seen today for ongoing prenatal care.  She is currently monitored for the following issues for this low-risk pregnancy and has Supervision of normal first pregnancy in third trimester; Cervical shortening affecting pregnancy; and History of ELISA positive for HSV on her problem list.  Patient reports pelvic pressure.  Contractions: Not present. Vag. Bleeding: None.  Movement: Present. Denies leaking of fluid.   The following portions of the patient's history were reviewed and updated as appropriate: allergies, current medications, past family history, past medical history, past social history, past surgical history and problem list. Problem list updated.  Objective:   Vitals:   09/30/16 1011  BP: 120/79  Pulse: 98  Temp: 99.2 F (37.3 C)  Weight: 189 lb 3.2 oz (85.8 kg)    Fetal Status: Fetal Heart Rate (bpm): 144 Fundal Height: 39 cm Movement: Present  Presentation: Vertex  General:  Alert, oriented and cooperative. Patient is in no acute distress.  Skin: Skin is warm and dry. No rash noted.   Cardiovascular: Normal heart rate noted  Respiratory: Normal respiratory effort, no problems with respiration noted  Abdomen: Soft, gravid, appropriate for gestational age. Pain/Pressure: Absent     Pelvic:  Cervical exam performed Dilation: 1.5 Effacement (%): 70 Station: -2  Extremities: Normal range of motion.  Edema: Trace  Mental Status: Normal mood and affect. Normal behavior. Normal judgment and thought content.   Assessment and Plan:  Pregnancy: G1P0 at 5360w6d  1. Supervision of normal first pregnancy in third trimester Term labor symptoms and general obstetric precautions including but not limited to vaginal bleeding, contractions, leaking of fluid and fetal movement were reviewed in detail with the patient. Please refer to After Visit Summary for other counseling recommendations.  Return in  about 1 week (around 10/07/2016) for OB Visit.   Tereso NewcomerUgonna A Rashed Edler, MD

## 2016-10-03 ENCOUNTER — Encounter (HOSPITAL_COMMUNITY): Payer: Self-pay

## 2016-10-03 ENCOUNTER — Inpatient Hospital Stay (HOSPITAL_COMMUNITY)
Admission: AD | Admit: 2016-10-03 | Discharge: 2016-10-03 | Disposition: A | Discharge status: Home / Self Care | Payer: BLUE CROSS/BLUE SHIELD | Source: Ambulatory Visit | Attending: Family Medicine | Admitting: Family Medicine

## 2016-10-03 DIAGNOSIS — Z3A39 39 weeks gestation of pregnancy: Secondary | ICD-10-CM | POA: Diagnosis not present

## 2016-10-03 DIAGNOSIS — Z3493 Encounter for supervision of normal pregnancy, unspecified, third trimester: Secondary | ICD-10-CM | POA: Diagnosis present

## 2016-10-03 DIAGNOSIS — O26879 Cervical shortening, unspecified trimester: Secondary | ICD-10-CM

## 2016-10-03 NOTE — MAU Note (Signed)
Waking up in the morning with pelvic pain.  Been happening the last few days.  Does sleep on side with a body pillow. Improves as the day goes on

## 2016-10-06 ENCOUNTER — Ambulatory Visit (INDEPENDENT_AMBULATORY_CARE_PROVIDER_SITE_OTHER): Payer: BLUE CROSS/BLUE SHIELD | Admitting: Certified Nurse Midwife

## 2016-10-06 ENCOUNTER — Encounter (HOSPITAL_COMMUNITY): Payer: Self-pay | Admitting: *Deleted

## 2016-10-06 ENCOUNTER — Inpatient Hospital Stay (HOSPITAL_COMMUNITY)
Admission: AD | Admit: 2016-10-06 | Discharge: 2016-10-06 | Disposition: A | Discharge status: Home / Self Care | Payer: BLUE CROSS/BLUE SHIELD | Source: Ambulatory Visit | Attending: Obstetrics & Gynecology | Admitting: Obstetrics & Gynecology

## 2016-10-06 VITALS — BP 108/74 | HR 94 | Wt 192.0 lb

## 2016-10-06 DIAGNOSIS — O26879 Cervical shortening, unspecified trimester: Secondary | ICD-10-CM

## 2016-10-06 DIAGNOSIS — Z3403 Encounter for supervision of normal first pregnancy, third trimester: Secondary | ICD-10-CM

## 2016-10-06 NOTE — Patient Instructions (Addendum)
Third Trimester of Pregnancy The third trimester is from week 29 through week 42, months 7 through 9. The third trimester is a time when the fetus is growing rapidly. At the end of the ninth month, the fetus is about 20 inches in length and weighs 6-10 pounds.  BODY CHANGES Your body goes through many changes during pregnancy. The changes vary from woman to woman.   Your weight will continue to increase. You can expect to gain 25-35 pounds (11-16 kg) by the end of the pregnancy.  You may begin to get stretch marks on your hips, abdomen, and breasts.  You may urinate more often because the fetus is moving lower into your pelvis and pressing on your bladder.  You may develop or continue to have heartburn as a result of your pregnancy.  You may develop constipation because certain hormones are causing the muscles that push waste through your intestines to slow down.  You may develop hemorrhoids or swollen, bulging veins (varicose veins).  You may have pelvic pain because of the weight gain and pregnancy hormones relaxing your joints between the bones in your pelvis. Backaches may result from overexertion of the muscles supporting your posture.  You may have changes in your hair. These can include thickening of your hair, rapid growth, and changes in texture. Some women also have hair loss during or after pregnancy, or hair that feels dry or thin. Your hair will most likely return to normal after your baby is born.  Your breasts will continue to grow and be tender. A yellow discharge may leak from your breasts called colostrum.  Your belly button may stick out.  You may feel short of breath because of your expanding uterus.  You may notice the fetus "dropping," or moving lower in your abdomen.  You may have a bloody mucus discharge. This usually occurs a few days to a week before labor begins.  Your cervix becomes thin and soft (effaced) near your due date. WHAT TO EXPECT AT YOUR PRENATAL  EXAMS  You will have prenatal exams every 2 weeks until week 36. Then, you will have weekly prenatal exams. During a routine prenatal visit:  You will be weighed to make sure you and the fetus are growing normally.  Your blood pressure is taken.  Your abdomen will be measured to track your baby's growth.  The fetal heartbeat will be listened to.  Any test results from the previous visit will be discussed.  You may have a cervical check near your due date to see if you have effaced. At around 36 weeks, your caregiver will check your cervix. At the same time, your caregiver will also perform a test on the secretions of the vaginal tissue. This test is to determine if a type of bacteria, Group B streptococcus, is present. Your caregiver will explain this further. Your caregiver may ask you:  What your birth plan is.  How you are feeling.  If you are feeling the baby move.  If you have had any abnormal symptoms, such as leaking fluid, bleeding, severe headaches, or abdominal cramping.  If you are using any tobacco products, including cigarettes, chewing tobacco, and electronic cigarettes.  If you have any questions. Other tests or screenings that may be performed during your third trimester include:  Blood tests that check for low iron levels (anemia).  Fetal testing to check the health, activity level, and growth of the fetus. Testing is done if you have certain medical conditions or if   there are problems during the pregnancy.  HIV (human immunodeficiency virus) testing. If you are at high risk, you may be screened for HIV during your third trimester of pregnancy. FALSE LABOR You may feel small, irregular contractions that eventually go away. These are called Braxton Hicks contractions, or false labor. Contractions may last for hours, days, or even weeks before true labor sets in. If contractions come at regular intervals, intensify, or become painful, it is best to be seen by your  caregiver.  SIGNS OF LABOR   Menstrual-like cramps.  Contractions that are 5 minutes apart or less.  Contractions that start on the top of the uterus and spread down to the lower abdomen and back.  A sense of increased pelvic pressure or back pain.  A watery or bloody mucus discharge that comes from the vagina. If you have any of these signs before the 37th week of pregnancy, call your caregiver right away. You need to go to the hospital to get checked immediately. HOME CARE INSTRUCTIONS   Avoid all smoking, herbs, alcohol, and unprescribed drugs. These chemicals affect the formation and growth of the baby.  Do not use any tobacco products, including cigarettes, chewing tobacco, and electronic cigarettes. If you need help quitting, ask your health care provider. You may receive counseling support and other resources to help you quit.  Follow your caregiver's instructions regarding medicine use. There are medicines that are either safe or unsafe to take during pregnancy.  Exercise only as directed by your caregiver. Experiencing uterine cramps is a good sign to stop exercising.  Continue to eat regular, healthy meals.  Wear a good support bra for breast tenderness.  Do not use hot tubs, steam rooms, or saunas.  Wear your seat belt at all times when driving.  Avoid raw meat, uncooked cheese, cat litter boxes, and soil used by cats. These carry germs that can cause birth defects in the baby.  Take your prenatal vitamins.  Take 1500-2000 mg of calcium daily starting at the 20th week of pregnancy until you deliver your baby.  Try taking a stool softener (if your caregiver approves) if you develop constipation. Eat more high-fiber foods, such as fresh vegetables or fruit and whole grains. Drink plenty of fluids to keep your urine clear or pale yellow.  Take warm sitz baths to soothe any pain or discomfort caused by hemorrhoids. Use hemorrhoid cream if your caregiver approves.  If  you develop varicose veins, wear support hose. Elevate your feet for 15 minutes, 3-4 times a day. Limit salt in your diet.  Avoid heavy lifting, wear low heal shoes, and practice good posture.  Rest a lot with your legs elevated if you have leg cramps or low back pain.  Visit your dentist if you have not gone during your pregnancy. Use a soft toothbrush to brush your teeth and be gentle when you floss.  A sexual relationship may be continued unless your caregiver directs you otherwise.  Do not travel far distances unless it is absolutely necessary and only with the approval of your caregiver.  Take prenatal classes to understand, practice, and ask questions about the labor and delivery.  Make a trial run to the hospital.  Pack your hospital bag.  Prepare the baby's nursery.  Continue to go to all your prenatal visits as directed by your caregiver. SEEK MEDICAL CARE IF:  You are unsure if you are in labor or if your water has broken.  You have dizziness.  You have   mild pelvic cramps, pelvic pressure, or nagging pain in your abdominal area.  You have persistent nausea, vomiting, or diarrhea.  You have a bad smelling vaginal discharge.  You have pain with urination. SEEK IMMEDIATE MEDICAL CARE IF:   You have a fever.  You are leaking fluid from your vagina.  You have spotting or bleeding from your vagina.  You have severe abdominal cramping or pain.  You have rapid weight loss or gain.  You have shortness of breath with chest pain.  You notice sudden or extreme swelling of your face, hands, ankles, feet, or legs.  You have not felt your baby move in over an hour.  You have severe headaches that do not go away with medicine.  You have vision changes.   This information is not intended to replace advice given to you by your health care provider. Make sure you discuss any questions you have with your health care provider.   Document Released: 11/08/2001 Document  Revised: 12/05/2014 Document Reviewed: 01/15/2013 Elsevier Interactive Patient Education 2016 [employer]  Vaginal Delivery During delivery, your health care provider will help you give birth to your baby. During a vaginal delivery, you will work to push the baby out of your vagina. However, before you can push your baby out, a few things need to happen. The opening of your uterus (cervix) has to soften, thin out, and open up (dilate) all the way to 10 cm. Also, your baby has to move down from the uterus into your vagina.  SIGNS OF LABOR  Your health care provider will first need to make sure you are in labor. Signs of labor include:   Passing what is called the mucous plug before labor begins. This is a small amount of blood-stained mucus.  Having regular, painful uterine contractions.   The time between contractions gets shorter.   The discomfort and pain gradually get more intense.  Contraction pains get worse when walking and do not go away when resting.   Your cervix becomes thinner (effacement) and dilates. BEFORE THE DELIVERY Once you are in labor and admitted into the hospital or care center, your health care provider may do the following:   Perform a complete physical exam.  Review any complications related to pregnancy or labor.  Check your blood pressure, pulse, temperature, and heart rate (vital signs).   Determine if, and when, the rupture of amniotic membranes occurred.  Do a vaginal exam (using a sterile glove and lubricant) to determine:   The position (presentation) of the baby. Is the baby's head presenting first (vertex) in the birth canal (vagina), or are the feet or buttocks first (breech)?   The level (station) of the baby's head within the birth canal.   The effacement and dilatation of the cervix.   An electronic fetal monitor is usually placed on your abdomen when you first arrive. This is used to monitor your contractions and the baby's  heart rate.  When the monitor is on your abdomen (external fetal monitor), it can only pick up the frequency and length of your contractions. It cannot tell the strength of your contractions.  If it becomes necessary for your health care provider to know exactly how strong your contractions are or to see exactly what the baby's heart rate is doing, an internal monitor may be inserted into your vagina and uterus. Your health care provider will discuss the benefits and risks of using an internal monitor and obtain your permission before inserting the   device.  Continuous fetal monitoring may be needed if you have an epidural, are receiving certain medicines (such as oxytocin), or have pregnancy or labor complications.  An IV access tube may be placed into a vein in your arm to deliver fluids and medicines if necessary. THREE STAGES OF LABOR AND DELIVERY Normal labor and delivery is divided into three stages. First Stage This stage starts when you begin to contract regularly and your cervix begins to efface and dilate. It ends when your cervix is completely open (fully dilated). The first stage is the longest stage of labor and can last from 3 hours to 15 hours.  Several methods are available to help with labor pain. You and your health care provider will decide which option is best for you. Options include:   Opioid medicines. These are strong pain medicines that you can get through your IV tube or as a shot into your muscle. These medicines lessen pain but do not make it go away completely.  Epidural. A medicine is given through a thin tube that is inserted in your back. The medicine numbs the lower part of your body and prevents any pain in that area.  Paracervical pain medicine. This is an injection of an anesthetic on each side of your cervix.   You may request natural childbirth, which does not involve the use of pain medicines or an epidural during labor and delivery. Instead, you will use  other things, such as breathing exercises, to help cope with the pain. Second Stage The second stage of labor begins when your cervix is fully dilated at 10 cm. It continues until you push your baby down through the birth canal and the baby is born. This stage can take only minutes or several hours.  The location of your baby's head as it moves through the birth canal is reported as a number called a station. If the baby's head has not started its descent, the station is described as being at minus 3 (-3). When your baby's head is at the zero station, it is at the middle of the birth canal and is engaged in the pelvis. The station of your baby helps indicate the progress of the second stage of labor.  When your baby is born, your health care provider may hold the baby with his or her head lowered to prevent amniotic fluid, mucus, and blood from getting into the baby's lungs. The baby's mouth and nose may be suctioned with a small bulb syringe to remove any additional fluid.  Your health care provider may then place the baby on your stomach. It is important to keep the baby from getting cold. To do this, the health care provider will dry the baby off, place the baby directly on your skin (with no blankets between you and the baby), and cover the baby with warm, dry blankets.   The umbilical cord is cut. Third Stage During the third stage of labor, your health care provider will deliver the placenta (afterbirth) and make sure your bleeding is under control. The delivery of the placenta usually takes about 5 minutes but can take up to 30 minutes. After the placenta is delivered, a medicine may be given either by IV or injection to help contract the uterus and control bleeding. If you are planning to breastfeed, you can try to do so now. After you deliver the placenta, your uterus should contract and get very firm. If your uterus does not remain firm, your health care   provider will massage it. This is  important because the contraction of the uterus helps cut off bleeding at the site where the placenta was attached to your uterus. If your uterus does not contract properly and stay firm, you may continue to bleed heavily. If there is a lot of bleeding, medicines may be given to contract the uterus and stop the bleeding.    This information is not intended to replace advice given to you by your health care provider. Make sure you discuss any questions you have with your health care provider.   Document Released: 08/23/2008 Document Revised: 12/05/2014 Document Reviewed: 07/11/2012 [employer] Interactive Patient Education 2016 [employer]  

## 2016-10-06 NOTE — Discharge Instructions (Signed)
Fetal Movement Counts °Patient Name: __________________________________________________ Patient Due Date: ____________________ °Performing a fetal movement count is highly recommended in high-risk pregnancies, but it is good for every pregnant woman to do. Your health care provider may ask you to start counting fetal movements at 28 weeks of the pregnancy. Fetal movements often increase: °· After eating a full meal. °· After physical activity. °· After eating or drinking something sweet or cold. °· At rest. °Pay attention to when you feel the baby is most active. This will help you notice a pattern of your baby's sleep and wake cycles and what factors contribute to an increase in fetal movement. It is important to perform a fetal movement count at the same time each day when your baby is normally most active.  °HOW TO COUNT FETAL MOVEMENTS °1. Find a quiet and comfortable area to sit or lie down on your left side. Lying on your left side provides the best blood and oxygen circulation to your baby. °2. Write down the day and time on a sheet of paper or in a journal. °3. Start counting kicks, flutters, swishes, rolls, or jabs in a 2-hour period. You should feel at least 10 movements within 2 hours. °4. If you do not feel 10 movements in 2 hours, wait 2-3 hours and count again. Look for a change in the pattern or not enough counts in 2 hours. °SEEK MEDICAL CARE IF: °· You feel less than 10 counts in 2 hours, tried twice. °· There is no movement in over an hour. °· The pattern is changing or taking longer each day to reach 10 counts in 2 hours. °· You feel the baby is not moving as he or she usually does. °Date: ____________ Movements: ____________ Start time: ____________ Finish time: ____________  °Date: ____________ Movements: ____________ Start time: ____________ Finish time: ____________ °Date: ____________ Movements: ____________ Start time: ____________ Finish time: ____________ °Date: ____________ Movements:  ____________ Start time: ____________ Finish time: ____________ °Date: ____________ Movements: ____________ Start time: ____________ Finish time: ____________ °Date: ____________ Movements: ____________ Start time: ____________ Finish time: ____________ °Date: ____________ Movements: ____________ Start time: ____________ Finish time: ____________ °Date: ____________ Movements: ____________ Start time: ____________ Finish time: ____________  °Date: ____________ Movements: ____________ Start time: ____________ Finish time: ____________ °Date: ____________ Movements: ____________ Start time: ____________ Finish time: ____________ °Date: ____________ Movements: ____________ Start time: ____________ Finish time: ____________ °Date: ____________ Movements: ____________ Start time: ____________ Finish time: ____________ °Date: ____________ Movements: ____________ Start time: ____________ Finish time: ____________ °Date: ____________ Movements: ____________ Start time: ____________ Finish time: ____________ °Date: ____________ Movements: ____________ Start time: ____________ Finish time: ____________  °Date: ____________ Movements: ____________ Start time: ____________ Finish time: ____________ °Date: ____________ Movements: ____________ Start time: ____________ Finish time: ____________ °Date: ____________ Movements: ____________ Start time: ____________ Finish time: ____________ °Date: ____________ Movements: ____________ Start time: ____________ Finish time: ____________ °Date: ____________ Movements: ____________ Start time: ____________ Finish time: ____________ °Date: ____________ Movements: ____________ Start time: ____________ Finish time: ____________ °Date: ____________ Movements: ____________ Start time: ____________ Finish time: ____________  °Date: ____________ Movements: ____________ Start time: ____________ Finish time: ____________ °Date: ____________ Movements: ____________ Start time: ____________ Finish  time: ____________ °Date: ____________ Movements: ____________ Start time: ____________ Finish time: ____________ °Date: ____________ Movements: ____________ Start time: ____________ Finish time: ____________ °Date: ____________ Movements: ____________ Start time: ____________ Finish time: ____________ °Date: ____________ Movements: ____________ Start time: ____________ Finish time: ____________ °Date: ____________ Movements: ____________ Start time: ____________ Finish time: ____________  °Date: ____________ Movements: ____________ Start time: ____________ Finish   time: ____________ °Date: ____________ Movements: ____________ Start time: ____________ Finish time: ____________ °Date: ____________ Movements: ____________ Start time: ____________ Finish time: ____________ °Date: ____________ Movements: ____________ Start time: ____________ Finish time: ____________ °Date: ____________ Movements: ____________ Start time: ____________ Finish time: ____________ °Date: ____________ Movements: ____________ Start time: ____________ Finish time: ____________ °Date: ____________ Movements: ____________ Start time: ____________ Finish time: ____________  °Date: ____________ Movements: ____________ Start time: ____________ Finish time: ____________ °Date: ____________ Movements: ____________ Start time: ____________ Finish time: ____________ °Date: ____________ Movements: ____________ Start time: ____________ Finish time: ____________ °Date: ____________ Movements: ____________ Start time: ____________ Finish time: ____________ °Date: ____________ Movements: ____________ Start time: ____________ Finish time: ____________ °Date: ____________ Movements: ____________ Start time: ____________ Finish time: ____________ °Date: ____________ Movements: ____________ Start time: ____________ Finish time: ____________  °Date: ____________ Movements: ____________ Start time: ____________ Finish time: ____________ °Date: ____________  Movements: ____________ Start time: ____________ Finish time: ____________ °Date: ____________ Movements: ____________ Start time: ____________ Finish time: ____________ °Date: ____________ Movements: ____________ Start time: ____________ Finish time: ____________ °Date: ____________ Movements: ____________ Start time: ____________ Finish time: ____________ °Date: ____________ Movements: ____________ Start time: ____________ Finish time: ____________ °Date: ____________ Movements: ____________ Start time: ____________ Finish time: ____________  °Date: ____________ Movements: ____________ Start time: ____________ Finish time: ____________ °Date: ____________ Movements: ____________ Start time: ____________ Finish time: ____________ °Date: ____________ Movements: ____________ Start time: ____________ Finish time: ____________ °Date: ____________ Movements: ____________ Start time: ____________ Finish time: ____________ °Date: ____________ Movements: ____________ Start time: ____________ Finish time: ____________ °Date: ____________ Movements: ____________ Start time: ____________ Finish time: ____________ °  °This information is not intended to replace advice given to you by your health care provider. Make sure you discuss any questions you have with your health care provider. °  °Document Released: 12/14/2006 Document Revised: 12/05/2014 Document Reviewed: 09/10/2012 °Elsevier Interactive Patient Education ©2016 [employer] °Braxton Hicks Contractions °Contractions of the uterus can occur throughout pregnancy. Contractions are not always a sign that you are in labor.  °WHAT ARE BRAXTON HICKS CONTRACTIONS?  °Contractions that occur before labor are called Braxton Hicks contractions, or false labor. Toward the end of pregnancy (32-34 weeks), these contractions can develop more often and may become more forceful. This is not true labor because these contractions do not result in opening (dilatation) and thinning of  the cervix. They are sometimes difficult to tell apart from true labor because these contractions can be forceful and people have different pain tolerances. You should not feel embarrassed if you go to the hospital with false labor. Sometimes, the only way to tell if you are in true labor is for your health care provider to look for changes in the cervix. °If there are no prenatal problems or other health problems associated with the pregnancy, it is completely safe to be sent home with false labor and await the onset of true labor. °HOW CAN YOU TELL THE DIFFERENCE BETWEEN TRUE AND FALSE LABOR? °False Labor °· The contractions of false labor are usually shorter and not as hard as those of true labor.   °· The contractions are usually irregular.   °· The contractions are often felt in the front of the lower abdomen and in the groin.   °· The contractions may go away when you walk around or change positions while lying down.   °· The contractions get weaker and are shorter lasting as time goes on.   °· The contractions do not usually become progressively stronger, regular, and closer together as with true labor.   °True Labor °· Contractions in true   labor last 30-70 seconds, become very regular, usually become more intense, and increase in frequency.   °· The contractions do not go away with walking.   °· The discomfort is usually felt in the top of the uterus and spreads to the lower abdomen and low back.   °· True labor can be determined by your health care provider with an exam. This will show that the cervix is dilating and getting thinner.   °WHAT TO REMEMBER °· Keep up with your usual exercises and follow other instructions given by your health care provider.   °· Take medicines as directed by your health care provider.   °· Keep your regular prenatal appointments.   °· Eat and drink lightly if you think you are going into labor.   °· If Braxton Hicks contractions are making you uncomfortable:   °¨ Change your  position from lying down or resting to walking, or from walking to resting.   °¨ Sit and rest in a tub of warm water.   °¨ Drink 2-3 glasses of water. Dehydration may cause these contractions.   °¨ Do slow and deep breathing several times an hour.   °WHEN SHOULD I SEEK IMMEDIATE MEDICAL CARE? °Seek immediate medical care if: °· Your contractions become stronger, more regular, and closer together.   °· You have fluid leaking or gushing from your vagina.   °· You have a fever.   °· You pass blood-tinged mucus.   °· You have vaginal bleeding.   °· You have continuous abdominal pain.   °· You have low back pain that you never had before.   °· You feel your baby's head pushing down and causing pelvic pressure.   °· Your baby is not moving as much as it used to.   °  °This information is not intended to replace advice given to you by your health care provider. Make sure you discuss any questions you have with your health care provider. °  °Document Released: 11/14/2005 Document Revised: 11/19/2013 Document Reviewed: 08/26/2013 °Elsevier Interactive Patient Education ©2016 [employer] ° °

## 2016-10-06 NOTE — Progress Notes (Signed)
Pt states that she has had ?leaking since last night. Pt states that she woke up with wet underwear and has had to change during the day.

## 2016-10-06 NOTE — MAU Note (Signed)
PT SAYS SHE WENT  TO OFFICE  TODAY-  STRIPPED MEMBRANES-  VE  2  CM .      SAYS UC  ARE STRONG -   SINCE 630PM.       DENIES  MRSA.    HAS HX  OF  HSV-  LAST OUTBREAK-   NONE   DURING  PREG-  BUT  TAKES  VALTREX   DAILY.     GBS- POSITIVE.

## 2016-10-06 NOTE — Progress Notes (Signed)
Subjective:    Kristin Stokes is a 20 y.o. female being seen today for her obstetrical visit. She is at 2437w5d gestation. Patient reports no complaints. Fetal movement: normal.  Problem List Items Addressed This Visit      Other   Supervision of normal first pregnancy in third trimester - Primary     Patient Active Problem List   Diagnosis Date Noted  . History of ELISA positive for HSV 09/22/2016  . Cervical shortening affecting pregnancy 08/16/2016  . Supervision of normal first pregnancy in third trimester 02/17/2016    Objective:    BP 108/74   Pulse 94   Wt 192 lb (87.1 kg)   BMI 28.35 kg/m  FHT: 137 BPM  Uterine Size: 39 cm and size equals dates  Presentations: cephalic  Pelvic Exam:              Dilation: 1cm       Effacement: 50%             Station:  -3    Consistency: medium            Position: middle     Assessment:    Pregnancy @ 1637w5d weeks   Plan:   Plans for delivery: Vaginal anticipated; labs reviewed; problem list updated Counseling: Consent signed. Infant feeding: plans to breastfeed. Cigarette smoking: never smoked. L&D discussion: symptoms of labor, discussed when to call, discussed what number to call, anesthetic/analgesic options reviewed and delivering clinician:  plans no preference. Postpartum supports and preparation: circumcision discussed and contraception plans discussed.  Follow up in 1 Week with NST and if not delivered schedule IOL.

## 2016-10-06 NOTE — MAU Note (Signed)
Notified provider that patient is here for a labor check. Patient was 3/80/-2 and is unchanged after an hour. Provider said patient can be discharged with labor precautions.

## 2016-10-07 ENCOUNTER — Encounter (HOSPITAL_COMMUNITY): Payer: Self-pay | Admitting: *Deleted

## 2016-10-07 ENCOUNTER — Inpatient Hospital Stay (HOSPITAL_COMMUNITY): Payer: BLUE CROSS/BLUE SHIELD | Admitting: Anesthesiology

## 2016-10-07 ENCOUNTER — Inpatient Hospital Stay (HOSPITAL_COMMUNITY)
Admission: AD | Admit: 2016-10-07 | Discharge: 2016-10-09 | DRG: 774 | Disposition: A | Discharge status: Home / Self Care | Payer: BLUE CROSS/BLUE SHIELD | Source: Ambulatory Visit | Attending: Obstetrics & Gynecology | Admitting: Obstetrics & Gynecology

## 2016-10-07 DIAGNOSIS — O99824 Streptococcus B carrier state complicating childbirth: Secondary | ICD-10-CM | POA: Diagnosis present

## 2016-10-07 DIAGNOSIS — O26873 Cervical shortening, third trimester: Secondary | ICD-10-CM | POA: Diagnosis present

## 2016-10-07 DIAGNOSIS — Z833 Family history of diabetes mellitus: Secondary | ICD-10-CM

## 2016-10-07 DIAGNOSIS — O9832 Other infections with a predominantly sexual mode of transmission complicating childbirth: Secondary | ICD-10-CM | POA: Diagnosis present

## 2016-10-07 DIAGNOSIS — Z3A39 39 weeks gestation of pregnancy: Secondary | ICD-10-CM | POA: Diagnosis not present

## 2016-10-07 DIAGNOSIS — Z91040 Latex allergy status: Secondary | ICD-10-CM

## 2016-10-07 DIAGNOSIS — O26879 Cervical shortening, unspecified trimester: Secondary | ICD-10-CM

## 2016-10-07 DIAGNOSIS — Z8249 Family history of ischemic heart disease and other diseases of the circulatory system: Secondary | ICD-10-CM | POA: Diagnosis not present

## 2016-10-07 DIAGNOSIS — A6000 Herpesviral infection of urogenital system, unspecified: Secondary | ICD-10-CM | POA: Diagnosis present

## 2016-10-07 DIAGNOSIS — O98313 Other infections with a predominantly sexual mode of transmission complicating pregnancy, third trimester: Secondary | ICD-10-CM | POA: Diagnosis not present

## 2016-10-07 DIAGNOSIS — Z3403 Encounter for supervision of normal first pregnancy, third trimester: Secondary | ICD-10-CM | POA: Diagnosis present

## 2016-10-07 LAB — CBC
HCT: 39.6 % (ref 36.0–46.0)
Hemoglobin: 14.7 g/dL (ref 12.0–15.0)
MCH: 32.5 pg (ref 26.0–34.0)
MCHC: 37.1 g/dL — ABNORMAL HIGH (ref 30.0–36.0)
MCV: 87.4 fL (ref 78.0–100.0)
Platelets: 234 10*3/uL (ref 150–400)
RBC: 4.53 MIL/uL (ref 3.87–5.11)
RDW: 13.5 % (ref 11.5–15.5)
WBC: 14.6 10*3/uL — ABNORMAL HIGH (ref 4.0–10.5)

## 2016-10-07 LAB — TYPE AND SCREEN
ABO/RH(D): O POS
Antibody Screen: NEGATIVE

## 2016-10-07 LAB — RPR: RPR Ser Ql: NONREACTIVE

## 2016-10-07 LAB — ABO/RH: ABO/RH(D): O POS

## 2016-10-07 MED ORDER — OXYCODONE-ACETAMINOPHEN 5-325 MG PO TABS: 2.0000 | ORAL_TABLET | ORAL | Status: DC | PRN

## 2016-10-07 MED ORDER — ONDANSETRON HCL 4 MG/2ML IJ SOLN: 4.0000 mg | INTRAMUSCULAR | Status: DC | PRN

## 2016-10-07 MED ORDER — ZOLPIDEM TARTRATE 5 MG PO TABS: 5.0000 mg | ORAL_TABLET | Freq: Every evening | ORAL | Status: DC | PRN

## 2016-10-07 MED ORDER — OXYTOCIN 40 UNITS IN LACTATED RINGERS INFUSION - SIMPLE MED
2.5000 [IU]/h | INTRAVENOUS | Status: DC
  Administered 2016-10-07: 2.5 [IU]/h via INTRAVENOUS
  Filled 2016-10-07: qty 1000

## 2016-10-07 MED ORDER — LACTATED RINGERS IV SOLN: 500.0000 mL | Freq: Once | INTRAVENOUS | Status: DC

## 2016-10-07 MED ORDER — LACTATED RINGERS IV SOLN
INTRAVENOUS | Status: DC
  Administered 2016-10-07: 03:00:00 via INTRAVENOUS

## 2016-10-07 MED ORDER — LIDOCAINE HCL (PF) 1 % IJ SOLN
INTRAMUSCULAR | Status: DC | PRN
  Administered 2016-10-07 (×2): 7 mL via EPIDURAL

## 2016-10-07 MED ORDER — SIMETHICONE 80 MG PO CHEW: 80.0000 mg | CHEWABLE_TABLET | ORAL | Status: DC | PRN

## 2016-10-07 MED ORDER — TETANUS-DIPHTH-ACELL PERTUSSIS 5-2.5-18.5 LF-MCG/0.5 IM SUSP
0.5000 mL | Freq: Once | INTRAMUSCULAR | Status: AC
  Administered 2016-10-08: 0.5 mL via INTRAMUSCULAR
  Filled 2016-10-07: qty 0.5

## 2016-10-07 MED ORDER — PENICILLIN G POTASSIUM 5000000 UNITS IJ SOLR
5.0000 10*6.[IU] | Freq: Once | INTRAVENOUS | Status: AC
  Administered 2016-10-07: 5 10*6.[IU] via INTRAVENOUS
  Filled 2016-10-07: qty 5

## 2016-10-07 MED ORDER — LIDOCAINE HCL (PF) 1 % IJ SOLN
30.0000 mL | INTRAMUSCULAR | Status: DC | PRN
  Administered 2016-10-07: 30 mL via SUBCUTANEOUS
  Filled 2016-10-07: qty 30

## 2016-10-07 MED ORDER — FENTANYL 2.5 MCG/ML BUPIVACAINE 1/10 % EPIDURAL INFUSION (WH - ANES)
INTRAMUSCULAR | Status: AC
  Filled 2016-10-07: qty 100

## 2016-10-07 MED ORDER — OXYTOCIN BOLUS FROM INFUSION: 500.0000 mL | Freq: Once | INTRAVENOUS | Status: DC

## 2016-10-07 MED ORDER — PENICILLIN G POT IN DEXTROSE 60000 UNIT/ML IV SOLN
3.0000 10*6.[IU] | INTRAVENOUS | Status: DC
  Administered 2016-10-07 (×2): 3 10*6.[IU] via INTRAVENOUS
  Filled 2016-10-07 (×8): qty 50

## 2016-10-07 MED ORDER — OXYCODONE-ACETAMINOPHEN 5-325 MG PO TABS
1.0000 | ORAL_TABLET | ORAL | Status: DC | PRN
Start: 2016-10-07 — End: 2016-10-09

## 2016-10-07 MED ORDER — FENTANYL 2.5 MCG/ML BUPIVACAINE 1/10 % EPIDURAL INFUSION (WH - ANES)
14.0000 mL/h | INTRAMUSCULAR | Status: DC | PRN
  Administered 2016-10-07 (×2): 14 mL/h via EPIDURAL
  Filled 2016-10-07: qty 100

## 2016-10-07 MED ORDER — ACETAMINOPHEN 325 MG PO TABS: 650.0000 mg | ORAL_TABLET | ORAL | Status: DC | PRN

## 2016-10-07 MED ORDER — PHENYLEPHRINE 40 MCG/ML (10ML) SYRINGE FOR IV PUSH (FOR BLOOD PRESSURE SUPPORT)
80.0000 ug | PREFILLED_SYRINGE | INTRAVENOUS | Status: DC | PRN
  Filled 2016-10-07: qty 5

## 2016-10-07 MED ORDER — SENNOSIDES-DOCUSATE SODIUM 8.6-50 MG PO TABS
2.0000 | ORAL_TABLET | ORAL | Status: DC
  Administered 2016-10-07 – 2016-10-09 (×2): 2 via ORAL
  Filled 2016-10-07 (×2): qty 2

## 2016-10-07 MED ORDER — EPHEDRINE 5 MG/ML INJ
10.0000 mg | INTRAVENOUS | Status: DC | PRN
  Filled 2016-10-07: qty 4

## 2016-10-07 MED ORDER — WITCH HAZEL-GLYCERIN EX PADS: 1.0000 "application " | MEDICATED_PAD | CUTANEOUS | Status: DC | PRN

## 2016-10-07 MED ORDER — DIBUCAINE 1 % RE OINT: 1.0000 "application " | TOPICAL_OINTMENT | RECTAL | Status: DC | PRN

## 2016-10-07 MED ORDER — ONDANSETRON HCL 4 MG PO TABS: 4.0000 mg | ORAL_TABLET | ORAL | Status: DC | PRN

## 2016-10-07 MED ORDER — ONDANSETRON HCL 4 MG/2ML IJ SOLN: 4.0000 mg | Freq: Four times a day (QID) | INTRAMUSCULAR | Status: DC | PRN

## 2016-10-07 MED ORDER — FENTANYL CITRATE (PF) 100 MCG/2ML IJ SOLN: 100.0000 ug | INTRAMUSCULAR | Status: DC | PRN

## 2016-10-07 MED ORDER — BENZOCAINE-MENTHOL 20-0.5 % EX AERO
1.0000 "application " | INHALATION_SPRAY | CUTANEOUS | Status: DC | PRN
  Administered 2016-10-07: 1 via TOPICAL
  Filled 2016-10-07: qty 56

## 2016-10-07 MED ORDER — SODIUM CHLORIDE 0.9 % IV SOLN: 2.0000 g | Freq: Once | INTRAVENOUS | Status: DC

## 2016-10-07 MED ORDER — DIPHENHYDRAMINE HCL 50 MG/ML IJ SOLN: 12.5000 mg | INTRAMUSCULAR | Status: DC | PRN

## 2016-10-07 MED ORDER — IBUPROFEN 600 MG PO TABS
600.0000 mg | ORAL_TABLET | Freq: Four times a day (QID) | ORAL | Status: DC
  Administered 2016-10-07 – 2016-10-09 (×7): 600 mg via ORAL
  Filled 2016-10-07 (×7): qty 1

## 2016-10-07 MED ORDER — FLEET ENEMA 7-19 GM/118ML RE ENEM: 1.0000 | ENEMA | RECTAL | Status: DC | PRN

## 2016-10-07 MED ORDER — DIPHENHYDRAMINE HCL 25 MG PO CAPS: 25.0000 mg | ORAL_CAPSULE | Freq: Four times a day (QID) | ORAL | Status: DC | PRN

## 2016-10-07 MED ORDER — OXYTOCIN 40 UNITS IN LACTATED RINGERS INFUSION - SIMPLE MED
1.0000 m[IU]/min | INTRAVENOUS | Status: DC
  Administered 2016-10-07: 2 m[IU]/min via INTRAVENOUS

## 2016-10-07 MED ORDER — PRENATAL MULTIVITAMIN CH
1.0000 | ORAL_TABLET | Freq: Every day | ORAL | Status: DC
  Administered 2016-10-08: 1 via ORAL
  Filled 2016-10-07: qty 1

## 2016-10-07 MED ORDER — TERBUTALINE SULFATE 1 MG/ML IJ SOLN
0.2500 mg | Freq: Once | INTRAMUSCULAR | Status: DC | PRN
  Filled 2016-10-07: qty 1

## 2016-10-07 MED ORDER — LACTATED RINGERS IV SOLN: 500.0000 mL | INTRAVENOUS | Status: DC | PRN

## 2016-10-07 MED ORDER — COCONUT OIL OIL
1.0000 "application " | TOPICAL_OIL | Status: DC | PRN
  Administered 2016-10-09: 1 via TOPICAL
  Filled 2016-10-07: qty 120

## 2016-10-07 MED ORDER — PHENYLEPHRINE 40 MCG/ML (10ML) SYRINGE FOR IV PUSH (FOR BLOOD PRESSURE SUPPORT)
PREFILLED_SYRINGE | INTRAVENOUS | Status: AC
  Filled 2016-10-07: qty 20

## 2016-10-07 MED ORDER — SOD CITRATE-CITRIC ACID 500-334 MG/5ML PO SOLN: 30.0000 mL | ORAL | Status: DC | PRN

## 2016-10-07 NOTE — Anesthesia Pain Management Evaluation Note (Signed)
  CRNA Pain Management Visit Note  Patient: Kristin Stokes, 20 y.o., female  "Hello I am a member of the anesthesia team at [facility]. We have an anesthesia team available at all times to provide care throughout the hospital, including epidural management and anesthesia for C-section. I don't know your plan for the delivery whether it a natural birth, water birth, IV sedation, nitrous supplementation, doula or epidural, but we want to meet your pain goals."   1.Was your pain managed to your expectations on prior hospitalizations?   No prior hospitalizations  2.What is your expectation for pain management during this hospitalization?     Epidural  3.How can we help you reach that goal? Epidural in place and functioning well. Pt comfortable  Record the patient's initial score and the patient's pain goal.   Pain: 2  Pain Goal: 5 [facility] wants you to be able to say your pain was always managed very well.  [facility],Kristin Stokes 10/07/2016

## 2016-10-07 NOTE — MAU Note (Signed)
Pt returns to MAU with complaint of worsening contractions.

## 2016-10-07 NOTE — Progress Notes (Signed)
Patient seen doing well. Comfortable with epidural. No complaints. Cervix unchanged. 7/100/+1. Will monitor for cervical change. AROM for thick meconium. Plan to start 2x2 pitocin. Category 1 tracing

## 2016-10-07 NOTE — Anesthesia Procedure Notes (Signed)
Epidural Patient location during procedure: OB Start time: 10/07/2016 4:22 AM End time: 10/07/2016 4:26 AM  Staffing Anesthesiologist: Leilani AbleHATCHETT, Lemonte Al Performed: anesthesiologist   Preanesthetic Checklist Completed: patient identified, surgical consent, pre-op evaluation, timeout performed, IV checked, risks and benefits discussed and monitors and equipment checked  Epidural Patient position: sitting Prep: site prepped and draped and DuraPrep Patient monitoring: continuous pulse ox and blood pressure Approach: midline Location: L3-L4 Injection technique: LOR air  Needle:  Needle type: Tuohy  Needle gauge: 17 G Needle length: 9 cm and 9 Needle insertion depth: 6 cm Catheter type: closed end flexible Catheter size: 19 Gauge Catheter at skin depth: 11 cm Test dose: negative and Other  Assessment Sensory level: T9 Events: blood not aspirated, injection not painful, no injection resistance, negative IV test and no paresthesia  Additional Notes Reason for block:procedure for pain

## 2016-10-07 NOTE — Anesthesia Postprocedure Evaluation (Signed)
Anesthesia Post Note  Patient: Kristin Stokes  Procedure(s) Performed: * No procedures listed *  Patient location during evaluation: Mother Baby Anesthesia Type: Epidural Level of consciousness: awake and alert, oriented and patient cooperative Pain management: pain level controlled Vital Signs Assessment: post-procedure vital signs reviewed and stable Respiratory status: spontaneous breathing, nonlabored ventilation and respiratory function stable Cardiovascular status: stable Postop Assessment: no headache, no backache, patient able to bend at knees, no signs of nausea or vomiting and adequate PO intake Anesthetic complications: no     Last Vitals:  Vitals:   10/07/16 1630 10/07/16 1812  BP: (!) 103/57 103/65  Pulse: (!) 118 (!) 116  Resp: 18 (!) 23  Temp: 37.4 C 37.2 C    Last Pain: 0  Pain Goal:  4               Shakedra Beam

## 2016-10-07 NOTE — Anesthesia Preprocedure Evaluation (Signed)

## 2016-10-07 NOTE — H&P (Signed)
LABOR AND DELIVERY ADMISSION HISTORY AND PHYSICAL NOTE  Kristin Stokes is a 20 y.o. female G1P0 with IUP at 3378w6d by US presenting for SOL.   She reports positive fetal movement. She denies leakage of fluid or vaginal bleeding.  Prenatal History/Complications:  Past Medical History: Past Medical History:  Diagnosis Date  . Genital herpes     Past Surgical History: Past Surgical History:  Procedure Laterality Date  . ANKLE SURGERY Right     Obstetrical History: OB History    Gravida Para Term Preterm AB Living   1             SAB TAB Ectopic Multiple Live Births                  Social History: Social History   Social History  . Marital status: Single    Spouse name: N/A  . Number of children: N/A  . Years of education: N/A   Social History Main Topics  . Smoking status: Never Smoker  . Smokeless tobacco: Never Used  . Alcohol use No     Comment: Not since finding out of pregnancy. Before hand, 1-2 times a month.   . Drug use: No     Comment: Last use of marijuana over 1 month ago.   Marland Kitchen. Sexual activity: Yes   Other Topics Concern  . None   Social History Narrative  . None    Family History: Family History  Problem Relation Age of Onset  . Asthma Mother   . Anxiety disorder Father   . Hypertension Other   . Diabetes Other   . Cancer Other   . COPD Maternal Grandmother   . Asthma Maternal Grandmother   . Hypertension Maternal Grandmother     Allergies: Allergies  Allergen Reactions  . Latex Rash    Prescriptions Prior to Admission  Medication Sig Dispense Refill Last Dose  . diphenhydrAMINE (BENADRYL) 25 MG tablet Take 25 mg by mouth every 6 (six) hours as needed for allergies. Reported on 04/21/2016   10/02/2016 at Unknown time  . Prenatal Vit-Fe Fumarate-FA (PRENATAL MULTIVITAMIN) TABS tablet Take 1 tablet by mouth daily at 12 noon. 30 tablet 1 10/06/2016 at Unknown time  . valACYclovir (VALTREX) 1000 MG tablet Take 1 tablet (1,000 mg  total) by mouth daily. 30 tablet 1 10/06/2016 at Unknown time     Review of Systems   All systems reviewed and negative except as stated in HPI  Blood pressure 122/81, pulse 114, temperature 99.1 F (37.3 C), temperature source Oral, resp. rate 17, height 5\' 9"  (1.753 m), weight 192 lb (87.1 kg), SpO2 100 %. General appearance: alert, cooperative, appears stated age and mild distress Heart: regular rate and rhythm with intact distal pulses Abdomen: soft, non-tender Extremities: No calf swelling or tenderness Fetal monitoring: Category 1 Uterine activity: Q4 minutes Dilation: 6 Effacement (%): 100 Station: -1 Exam by:: Weston,RN   Prenatal labs: ABO, Rh: --/--/O POS (11/10 16100319) Antibody: PENDING (11/10 0319) Rubella: !Error! RPR: Non Reactive (08/10 1100)  HBsAg: Negative (03/22 1348)  HIV: Non Reactive (08/10 1100)  GBS: Positive (10/17 0000)  1 hr Glucola: 77 Genetic screening:  Normal Anatomy US: Normal  Prenatal Transfer Tool  Maternal Diabetes: No Genetic Screening: Normal Maternal Ultrasounds/Referrals: Normal Fetal Ultrasounds or other Referrals:  None Maternal Substance Abuse:  No Significant Maternal Medications:  None Significant Maternal Lab Results: Lab values include: Group B Strep positive  Results for orders placed or performed during the  hospital encounter of 10/07/16 (from the past 24 hour(s))  Type and screen [facility]   Collection Time: 10/07/16  3:19 AM  Result Value Ref Range   ABO/RH(D) O POS    Antibody Screen PENDING    Sample Expiration 10/10/2016     Patient Active Problem List   Diagnosis Date Noted  . Normal labor 10/07/2016  . History of ELISA positive for HSV 09/22/2016  . Cervical shortening affecting pregnancy 08/16/2016  . Supervision of normal first pregnancy in third trimester 02/17/2016    Assessment: Kristin Stokes is a 20 y.o. G1P0 at 7379w6d here for SOL  #Labor:Patient is admitted in active  labor. Will continue to monitor for natural progression #Pain: Pending epidural #FWB: Category 1 #ID:  GBS Positive, initially ordered Ampicillin but switched to PCN upon decreased frequency of contraction with decreased discomfort #MOF: Breast #MOC:POP #Circ:  NA  Josue D Santos 10/07/2016, 3:54 AM   CNM attestation:  I have seen and examined this patient; I agree with above documentation in the resident's note.   Kristin Stokes is a 20 y.o. G1P0 here for SOL  PE: BP (!) 109/57   Pulse (!) 110   Temp 98.9 F (37.2 C) (Oral)   Resp 18   Ht 5\' 9"  (1.753 m)   Wt 87.1 kg (192 lb)   SpO2 (!) 84%   BMI 28.35 kg/m  Gen: calm comfortable, NAD Resp: normal effort, no distress Abd: gravid  ROS, labs, PMH reviewed  [facility] Expectant management Anticipate SVD  SHAW, KIMBERLY CNM 10/07/2016, 6:01 AM

## 2016-10-07 NOTE — Lactation Note (Signed)
This note was copied from a baby's chart. Lactation Consultation Note  Patient Name: Kristin Stokes ZOXWR'UToday's Date: 10/07/2016 Reason for consult: Initial assessment Assisted Mom with positioning and [facility]. Baby demonstrated good suckling bursts. Basic teaching reviewed with Mom. Encouraged to BF with feeding ques. Lactation brochure left for review, advised of OP services and support group. Encouraged Mom to call for assist as needed.   Maternal Data Has patient been taught Hand Expression?: Yes Does the patient have breastfeeding experience prior to this delivery?: No  Feeding Feeding Type: Breast Fed  LATCH Score/Interventions Latch: Grasps breast easily, tongue down, lips flanged, rhythmical sucking.  Audible Swallowing: A few with stimulation  Type of Nipple: Everted at rest and after stimulation  Comfort (Breast/Nipple): Soft / non-tender     Hold (Positioning): Assistance needed to correctly position infant at breast and maintain latch. Intervention(s): Breastfeeding basics reviewed;Support Pillows;Position options;Skin to skin  LATCH Score: 8  Lactation Tools Discussed/Used WIC Program: Yes   Consult Status Consult Status: Follow-up Date: 10/08/16 Follow-up type: In-patient    Alfred LevinsGranger, Shon Mansouri Ann 10/07/2016, 3:40 PM

## 2016-10-08 DIAGNOSIS — O98313 Other infections with a predominantly sexual mode of transmission complicating pregnancy, third trimester: Secondary | ICD-10-CM

## 2016-10-08 DIAGNOSIS — O99824 Streptococcus B carrier state complicating childbirth: Secondary | ICD-10-CM

## 2016-10-08 DIAGNOSIS — Z3A39 39 weeks gestation of pregnancy: Secondary | ICD-10-CM

## 2016-10-08 NOTE — Lactation Note (Signed)
This note was copied from a baby's chart. Lactation Consultation Note: Lactation Brochure given to mother . Basic teaching done. Mother states that staff nurse has been helping her get the baby latched on today. Mother states that infant is feeding much better. Reviewed cue base feeding and cluster feeding. Advised mother to feed infant with cues and at least 8-12 times in 24 hours. Ad[facility] to [facility] with next feeding.   Patient Name: Kristin Stokes  ZOXWR'UToday's Date: 10/08/2016     Maternal Data    Feeding Feeding Type: Breast Fed Length of feed: 0 min  LATCH Score/Interventions Latch: Repeated attempts needed to sustain latch, nipple held in mouth throughout feeding, stimulation needed to elicit sucking reflex. Intervention(s): Adjust position;Assist with latch;Breast massage;Breast compression  Audible Swallowing: Spontaneous and intermittent  Type of Nipple: Everted at rest and after stimulation  Comfort (Breast/Nipple): Soft / non-tender     Hold (Positioning): Assistance needed to correctly position infant at breast and maintain latch.  LATCH Score: 8  Lactation Tools Discussed/Used     Consult Status      Michel BickersKendrick, Blease Capaldi McCoy 10/08/2016, 1:50 PM

## 2016-10-08 NOTE — Clinical Social Work Maternal (Signed)
  CLINICAL SOCIAL WORK MATERNAL/CHILD NOTE  Patient Details  Name: Kristin Stokes MRN: [id number] Date of Birth: 10/26/1996  Date:  10/08/2016  Clinical Social Worker Initiating Note:  Ferdinand Lango Renee Erb, MSW, LCSW-A  Date/ Time Initiated:  10/08/16/1032     Child's Name:  Kristin Stokes   Legal Guardian:  Other (Comment) (Not established by court system; MO and FOB parent collectively )   Need for Interpreter:  None   Date of Referral:  10/07/16     Reason for Referral:  Current Substance Use/Substance Use During Pregnancy    Referral Source:  Physician   Address:  [address], [place] [address]  Phone number:  [phone]   Household Members:  Self, Significant Other Coila Wardell DOB 06/24/1991)   Natural Supports (not living in the home):  Immediate Family, Friends, Artist Supports: None   Employment: Unemployed   Type of Work: Unemployed    Education:  9 to 11 years   Museum/gallery curator Resources:  Medicaid, Loss adjuster, chartered )   Other Resources:  [employer], Physicist, medical    Cultural/Religious Considerations Which May Impact Care:  None reported at this time  Strengths:  Ability to meet basic needs , Compliance with medical plan , Home prepared for child , Pediatrician chosen  ([facility] )   Risk Factors/Current Problems:  Substance Use    Cognitive State:  Alert , Able to Concentrate , Goal Oriented , Insightful    Mood/Affect:  Calm , Comfortable , Interested , Relaxed    CSW Assessment: CSW met with MOB at bedside to complete assessment. At the time of this writer's arrival, MOB was doing skin to skin with baby and speaking with FOB. This Probation officer obtained MOB's permission to complete assessment with FOB in the room. This Probation officer explained role and reasoning for visit being due to MOB's hx of substance use during pregnancy. This Probation officer informed MOB of hospitals policy and procedure regarding substance. MOB verbalized  understanding and stated no concerns.   This Probation officer assessed MOB and FOB for any additional psychosocial support needs. None were identified at this time. CSW will continue to follow pending UDS and cord blood test results.   CSW Plan/Description:  Other (Comment) (CSW will continue to follow pending cord blood test results )    Ferdinand Lango Macklen Wilhoite, MSW, [facility]  Office: [phone]

## 2016-10-08 NOTE — Progress Notes (Signed)
Patient ID: Kristin Stokes, female   DOB: May 10, 1996, 20 y.o.   MRN: [id number]  POSTPARTUM PROGRESS NOTE  Post Partum Day #1 Subjective:  Kristin Stokes is a 20 y.o. G1P1001 5924w6d s/p NSVD.  No acute events overnight.  Pt denies problems with ambulating, voiding or po intake.  She denies nausea or vomiting.  Pain is well controlled.  She has had flatus. She has had bowel movement.  Lochia Small.   Objective: Blood pressure 101/61, pulse 98, temperature 98.2 F (36.8 C), temperature source Oral, resp. rate 20, height 5\' 9"  (1.753 m), weight 192 lb (87.1 kg), SpO2 98 %, unknown if currently breastfeeding.  Physical Exam:  General: alert, cooperative and no distress Lochia:normal flow Chest: CTAB Heart: RRR no m/r/g Abdomen: +BS, soft, nontender,  Uterine Fundus: firm, below umbilicus DVT Evaluation: No calf swelling or tenderness Extremities: Trace edema   Recent Labs  10/07/16 0319  HGB 14.7  HCT 39.6    Assessment/Plan:  ASSESSMENT: Kristin Stokes is a 20 y.o. G1P1001 7524w6d s/p NSVD  Breastfeeding, Lactation consult and Contraception POPs. May be discharged this afternoon pending if baby can be d/c.    LOS: 1 day   Jen MowElizabeth Remee Charley, DO OB Fellow Center [facility], [facility]  10/08/2016, 9:07 AM

## 2016-10-08 NOTE — Plan of Care (Signed)
Problem: Education: Goal: Knowledge of condition will improve Encouraged patient to empty bladder frequently in order to prevent abdominal discomfort/cramping. Discussed peri care in the shower and sitz bath.   Problem: Nutritional: Goal: Mothers verbalization of comfort with breastfeeding process will improve Outcome: Completed/Met Date Met: 10/08/16 Encouraged mother to call for breast feeding assistance and latch scoring.

## 2016-10-09 MED ORDER — IBUPROFEN 600 MG PO TABS: 600.0000 mg | ORAL_TABLET | Freq: Four times a day (QID) | ORAL | 0 refills | Status: DC

## 2016-10-09 MED ORDER — NORETHINDRONE 0.35 MG PO TABS: 1.0000 | ORAL_TABLET | Freq: Every day | ORAL | 11 refills | Status: DC

## 2016-10-09 NOTE — Plan of Care (Signed)
Problem: Education: Goal: Knowledge of condition will improve Discharge information reviewed with patient and significant. Patient verbalized understanding.

## 2016-10-09 NOTE — Discharge Summary (Signed)
OB Discharge Summary  Patient Name: Kristin Stokes DOB: September 14, 1996 MRN: [id number]  Date of admission: 10/07/2016 Delivering MD: Catalina AntiguaONSTANT, PEGGY   Date of discharge: 10/09/2016  Admitting diagnosis: 5653w5d, Contractions  Intrauterine pregnancy: 3858w6d     Secondary diagnosis:Active Problems:   Normal labor  Additional problems:none     Discharge diagnosis: Term Pregnancy Delivered                                                                     Post partum procedures:none  Augmentation: none  Complications: None  Hospital course:  Onset of Labor With Vaginal Delivery     20 y.o. yo G1P1001 at 6058w6d was admitted in Active Labor on 10/07/2016. Patient had an uncomplicated labor course as follows:  Membrane Rupture Time/Date: 9:24 AM ,10/07/2016   Intrapartum Procedures: Episiotomy: None [1]                                         Lacerations:  2nd degree [3]  Patient had a delivery of a Viable infant. 10/07/2016  Information for the patient's newborn:  Kristin Stokes, Girl Shyleigh [[id number]][[id number]]  Delivery Method: Vag-Spont    Pateint had an uncomplicated postpartum course.  She is ambulating, tolerating a regular diet, passing flatus, and urinating well. Patient is discharged home in stable condition on 10/09/16.    Physical exam Vitals:   10/08/16 0531 10/08/16 0730 10/08/16 1739 10/09/16 0604  BP: 99/70 101/61 115/72 111/71  Pulse: 98 98 98 95  Resp: 20 20 18 18   Temp: 98.1 F (36.7 C) 98.2 F (36.8 C) 98.6 F (37 C) 98.4 F (36.9 C)  TempSrc: Oral Oral  Oral  SpO2:      Weight:      Height:       General: alert, cooperative and no distress Lochia: appropriate Uterine Fundus: firm Incision: N/A DVT Evaluation: No evidence of DVT seen on physical exam. Labs: Lab Results  Component Value Date   WBC 14.6 (H) 10/07/2016   HGB 14.7 10/07/2016   HCT 39.6 10/07/2016   MCV 87.4 10/07/2016   PLT 234 10/07/2016   CMP Latest Ref Rng & Units 02/14/2016   Glucose 65 - 99 mg/dL 94  BUN 6 - 20 mg/dL 7  Creatinine 3.470.44 - 4.251.00 mg/dL 9.560.58  Sodium 387135 - 564145 mmol/L 141  Potassium 3.5 - 5.1 mmol/L 4.0  Chloride 101 - 111 mmol/L 109  CO2 22 - 32 mmol/L 23  Calcium 8.9 - 10.3 mg/dL 9.3    Discharge instruction: per After Visit Summary and "Baby and Me Booklet".  After Visit Meds:    Medication List    STOP taking these medications   diphenhydrAMINE 25 MG tablet Commonly known as:  BENADRYL     TAKE these medications   acetaminophen 500 MG tablet Commonly known as:  TYLENOL Take 1,000 mg by mouth every 6 (six) hours as needed for moderate pain.   ibuprofen 600 MG tablet Commonly known as:  ADVIL,MOTRIN Take 1 tablet (600 mg total) by mouth every 6 (six) hours.   norethindrone 0.35 MG tablet Commonly known as:  CAMILA Take 1  tablet (0.35 mg total) by mouth daily.   prenatal multivitamin Tabs tablet Take 1 tablet by mouth daily at 12 noon.   valACYclovir 1000 MG tablet Commonly known as:  VALTREX Take 1 tablet (1,000 mg total) by mouth daily.       Diet: routine diet  Activity: Advance as tolerated. Pelvic rest for 6 weeks.   Outpatient follow up:6 weeks Follow up Appt:Future Appointments Date Time Provider Department Center  10/13/2016 3:15 PM Roe Coombsachelle A Denney, CNM [facility] None   Follow up visit: No Follow-up on file.  Postpartum contraception: Progesterone only pills  Newborn Data: Live born female  Birth Weight: 7 lb 7.4 oz (3385 g) APGAR: 8, 9  Baby Feeding: Breast Disposition:home with mother   10/09/2016 Wyvonnia DuskyMarie Leda [place]feuille, CNM

## 2016-10-09 NOTE — Lactation Note (Signed)
This note was copied from a baby's chart. Lactation Consultation Note: infant is lying on mothers chest when I arrived in room for consult. Mother states that breastfeeding is going well. She is hearing infant swallow. Mother denies having any soreness on nipples. Reminded mother that infant will cluster feed several nights that this is normal for newborns.  Suggested that mother feed infant 8-12 times in 24 hours.  Mother advised to do good breast massage and ice breast to prevent engorgement. Reviewed S/S of Mastitis. Mother informed of LC services, outpt dept, BFSG's and phone line for assistance and questions.   Patient Name: Girl Phineas InchesJazzmyne Galbreath [id number]'s Date: 10/09/2016     Maternal Data    Feeding Feeding Type: Breast Fed Length of feed: 10 min  LATCH Score/Interventions                      Lactation Tools Discussed/Used     Consult Status      Michel BickersKendrick, Adra Shepler McCoy 10/09/2016, 8:52 AM

## 2016-10-13 ENCOUNTER — Encounter: Payer: BLUE CROSS/BLUE SHIELD | Admitting: Certified Nurse Midwife

## 2016-11-11 ENCOUNTER — Other Ambulatory Visit: Payer: Self-pay | Admitting: Obstetrics and Gynecology

## 2016-11-17 ENCOUNTER — Ambulatory Visit (INDEPENDENT_AMBULATORY_CARE_PROVIDER_SITE_OTHER): Payer: BLUE CROSS/BLUE SHIELD | Admitting: Obstetrics and Gynecology

## 2016-11-17 ENCOUNTER — Encounter: Payer: Self-pay | Admitting: Obstetrics and Gynecology

## 2016-11-17 ENCOUNTER — Encounter: Payer: Self-pay | Admitting: *Deleted

## 2016-11-17 DIAGNOSIS — B372 Candidiasis of skin and nail: Secondary | ICD-10-CM

## 2016-11-17 MED ORDER — NYSTATIN 100000 UNIT/GM EX CREA: TOPICAL_CREAM | CUTANEOUS | 1 refills | Status: DC

## 2016-11-17 NOTE — Progress Notes (Signed)
Post Partum Exam  Kristin Stokes is a 20 y.o. 411P1001 female who presents for a postpartum visit. She is 6 week postpartum following a spontaneous vaginal delivery. I have fully reviewed the prenatal and intrapartum course. The delivery was at 39 gestational weeks.  Anesthesia: epidural. Postpartum course has been normal. Baby's course has been normal. Baby is feeding by breast. Bleeding moderate lochia. Bowel function is normal. Bladder function is normal. Patient is not sexually active. Contraception method is oral progesterone-only contraceptive. Postpartum depression screening:neg  Pt reports lochia has almost stopped. She reports infant has thrush.  The following portions of the patient's history were reviewed and updated as appropriate: allergies, current medications, past family history, past medical history, past social history and past surgical history.  Review of Systems Pertinent items are noted in HPI.    Objective:    BP 116/78 mmHg  Pulse 78  Resp 16  Ht 5\' 5"  (1.651 m)  Wt 211 lb (95.709 kg)  BMI 35.11 kg/m2  Breastfeeding? Yes  General:  alert   Breasts:  Not evaluated  Lungs: clear to auscultation bilaterally  Heart:  regular rate and rhythm, S1, S2 normal, no murmur, click, rub or gallop  Abdomen: soft, non-tender; bowel sounds normal; no masses,  no organomegaly   Vulva:  not evaluated  Vagina: not evaluated  Cervix:  not evaluated  Corpus: not examined  Adnexa:  not evaluated  Rectal Exam: Not performed.        Assessment:    Normal postpartum exam. Yeast infection breast Plan:   1. Contraception: oral progesterone-only contraceptive 2. Nystatin for breast yeast infection 3. Follow up in: 3 months or as needed.

## 2016-12-07 ENCOUNTER — Telehealth: Payer: Self-pay | Admitting: *Deleted

## 2016-12-07 NOTE — Telephone Encounter (Signed)
Patient states she was given nystatin cream at her last appointment for yeast on her breast. She is calling to request a Diflucan prescription to go with that. Told patient would message with the provider for her.

## 2016-12-26 ENCOUNTER — Telehealth: Payer: Self-pay | Admitting: *Deleted

## 2016-12-26 MED ORDER — FLUCONAZOLE 150 MG PO TABS: 150.0000 mg | ORAL_TABLET | ORAL | 0 refills | Status: DC

## 2016-12-26 NOTE — Telephone Encounter (Signed)
Sent diflucan for thrush while breastfeeding

## 2017-01-23 ENCOUNTER — Telehealth: Payer: Self-pay

## 2017-01-23 DIAGNOSIS — B370 Candidal stomatitis: Secondary | ICD-10-CM

## 2017-01-23 MED ORDER — FLUCONAZOLE 150 MG PO TABS
150.0000 mg | ORAL_TABLET | ORAL | 0 refills | Status: DC
Start: 2017-01-23 — End: 2017-09-12

## 2017-01-23 NOTE — Telephone Encounter (Signed)
Returned call, and patient stated that breastfeeding baby has thrush again, pt requesting diflucan to be sent again, sent with provider approval.

## 2017-09-12 ENCOUNTER — Encounter: Payer: Self-pay | Admitting: Obstetrics & Gynecology

## 2017-09-12 ENCOUNTER — Ambulatory Visit (INDEPENDENT_AMBULATORY_CARE_PROVIDER_SITE_OTHER): Payer: BLUE CROSS/BLUE SHIELD | Admitting: Obstetrics & Gynecology

## 2017-09-12 VITALS — BP 121/73 | HR 84 | Ht 69.0 in | Wt 151.0 lb

## 2017-09-12 DIAGNOSIS — N898 Other specified noninflammatory disorders of vagina: Secondary | ICD-10-CM

## 2017-09-12 DIAGNOSIS — Z113 Encounter for screening for infections with a predominantly sexual mode of transmission: Secondary | ICD-10-CM

## 2017-09-12 NOTE — Patient Instructions (Signed)
Sexually Transmitted Disease  A sexually transmitted disease (STD) is a disease or infection that may be passed (transmitted) from person to person, usually during sexual activity. This may happen by way of saliva, semen, blood, vaginal mucus, or urine. Common STDs include:   Gonorrhea.   Chlamydia.   Syphilis.   HIV and AIDS.   Genital herpes.   Hepatitis B and C.   Trichomonas.   Human papillomavirus (HPV).   Pubic lice.   Scabies.   Mites.   Bacterial vaginosis.    What are the causes?  An STD may be caused by bacteria, a virus, or parasites. STDs are often transmitted during sexual activity if one person is infected. However, they may also be transmitted through nonsexual means. STDs may be transmitted after:   Sexual intercourse with an infected person.   Sharing sex toys with an infected person.   Sharing needles with an infected person or using unclean piercing or tattoo needles.   Having intimate contact with the genitals, mouth, or rectal areas of an infected person.   Exposure to infected fluids during birth.    What are the signs or symptoms?  Different STDs have different symptoms. Some people may not have any symptoms. If symptoms are present, they may include:   Painful or bloody urination.   Pain in the pelvis, abdomen, vagina, anus, throat, or eyes.   A skin rash, itching, or irritation.   Growths, ulcerations, blisters, or sores in the genital and anal areas.   Abnormal vaginal discharge with or without bad odor.   Penile discharge in men.   Fever.   Pain or bleeding during sexual intercourse.   Swollen glands in the groin area.   Yellow skin and eyes (jaundice). This is seen with hepatitis.   Swollen testicles.   Infertility.   Sores and blisters in the mouth.    How is this diagnosed?  To make a diagnosis, your health care provider may:   Take a medical history.   Perform a physical exam.   Take a sample of any discharge to examine.   Swab the throat, cervix,  opening to the penis, rectum, or vagina for testing.   Test a sample of your first morning urine.   Perform blood tests.   Perform a Pap test, if this applies.   Perform a colposcopy.   Perform a laparoscopy.    How is this treated?  Treatment depends on the STD. Some STDs may be treated but not cured.   Chlamydia, gonorrhea, trichomonas, and syphilis can be cured with antibiotic medicine.   Genital herpes, hepatitis, and HIV can be treated, but not cured, with prescribed medicines. The medicines lessen symptoms.   Genital warts from HPV can be treated with medicine or by freezing, burning (electrocautery), or surgery. Warts may come back.   HPV cannot be cured with medicine or surgery. However, abnormal areas may be removed from the cervix, vagina, or vulva.   If your diagnosis is confirmed, your recent sexual partners need treatment. This is true even if they are symptom-free or have a negative culture or evaluation. They should not have sex until their health care providers say it is okay.   Your health care provider may test you for infection again 3 months after treatment.    How is this prevented?  Take these steps to reduce your risk of getting an STD:   Use latex condoms, dental dams, and water-soluble lubricants during sexual activity. Do not use   petroleum jelly or oils.   Avoid having multiple sex partners.   Do not have sex with someone who has other sex partners.   Do not have sex with anyone you do not know or who is at high risk for an STD.   Avoid risky sex practices that can break your skin.   Do not have sex if you have open sores on your mouth or skin.   Avoid drinking too much alcohol or taking illegal drugs. Alcohol and drugs can affect your judgment and put you in a vulnerable position.   Avoid engaging in oral and anal sex acts.   Get vaccinated for HPV and hepatitis. If you have not received these vaccines in the past, talk to your health care provider about whether one or  both might be right for you.   If you are at risk of being infected with HIV, it is recommended that you take a prescription medicine daily to prevent HIV infection. This is called pre-exposure prophylaxis (PrEP). You are considered at risk if:  ? You are a man who has sex with other men (MSM).  ? You are a heterosexual man or woman and are sexually active with more than one partner.  ? You take drugs by injection.  ? You are sexually active with a partner who has HIV.   Talk with your health care provider about whether you are at high risk of being infected with HIV. If you choose to begin PrEP, you should first be tested for HIV. You should then be tested every 3 months for as long as you are taking PrEP.    Contact a health care provider if:   See your health care provider.   Tell your sexual partner(s). They should be tested and treated for any STDs.   Do not have sex until your health care provider says it is okay.  Get help right away if:  Contact your health care provider right away if:   You have severe abdominal pain.   You are a man and notice swelling or pain in your testicles.   You are a woman and notice swelling or pain in your vagina.    This information is not intended to replace advice given to you by your health care provider. Make sure you discuss any questions you have with your health care provider.  Document Released: 02/04/2003 Document Revised: 06/03/2016 Document Reviewed: 06/04/2013  Elsevier Interactive Patient Education  2018 [employer]vier Inc[employer]

## 2017-09-12 NOTE — Progress Notes (Signed)
Pt complains of having a vaginal discharge and rash.

## 2017-09-12 NOTE — Progress Notes (Signed)
Patient ID: Kristin Stokes, female   DOB: [birth year], 21 y.o.   MRN: [id number]  Chief Complaint  Patient presents with  . Gynecologic Exam  3 days of vulvar rash atypical of herpes  HPI Kristin Stokes is a 21 y.o. female.  G1P1001 No LMP recorded. She has noted a vulvar rash as above and scant discharge vaginally, no pain. She did not take her Valtrex HPI  Past Medical History:  Diagnosis Date  . Genital herpes     Past Surgical History:  Procedure Laterality Date  . ANKLE SURGERY Right     Family History  Problem Relation Age of Onset  . Asthma Mother   . Anxiety disorder Father   . Hypertension Other   . Diabetes Other   . Cancer Other   . COPD Maternal Grandmother   . Asthma Maternal Grandmother   . Hypertension Maternal Grandmother     Social History Social History  Substance Use Topics  . Smoking status: Never Smoker  . Smokeless tobacco: Never Used  . Alcohol use No     Comment: Not since finding out of pregnancy. Before hand, 1-2 times a month.     Allergies  Allergen Reactions  . Latex Rash    Current Outpatient Prescriptions  Medication Sig Dispense Refill  . acetaminophen (TYLENOL) 500 MG tablet Take 1,000 mg by mouth every 6 (six) hours as needed for moderate pain.    Marland Kitchen ibuprofen (ADVIL,MOTRIN) 600 MG tablet Take 1 tablet (600 mg total) by mouth every 6 (six) hours. 30 tablet 0  . nystatin cream (MYCOSTATIN) Apply thin layer to breast 2 times a day. Clean off prior to beast feeding 30 g 1  . Prenatal Vit-Fe Fumarate-FA (PREPLUS) 27-1 MG TABS TAKE 1 TABLET BY MOUTH EVERY DAY 30 tablet 0  . valACYclovir (VALTREX) 1000 MG tablet Take 1 tablet (1,000 mg total) by mouth daily. 30 tablet 1   No current facility-administered medications for this visit.     Review of Systems Review of Systems  Constitutional: Negative.   Gastrointestinal: Negative for abdominal pain.  Genitourinary: Positive for vaginal discharge (slight). Negative for  dysuria, pelvic pain, vaginal bleeding and vaginal pain (vulvar rash).    Blood pressure 121/73, pulse 84, height  (1.753 m), weight 151 lb (68.5 kg), currently breastfeeding.  Physical Exam Physical Exam  Constitutional: She is oriented to person, place, and time. She appears well-developed. No distress.  Cardiovascular: Normal rate.   Pulmonary/Chest: Effort normal.  Abdominal: Soft. There is no tenderness.  Genitourinary: Uterus normal. Vaginal discharge (slight discharge) found.  Genitourinary Comments: No mass or tenderness and vulva appears normal  Neurological: She is alert and oriented to person, place, and time.  Skin: Skin is warm and dry.  Psychiatric: She has a normal mood and affect. Her behavior is normal.  Vitals reviewed.   Data Reviewed Pap 2017 Previous STD testing Assessment     Vulvar and vaginal sx with screening for STD Doubt current HSV outbreak    Plan    STD screening testing pending including serology We will inform her of result Report if her sx worsen 15 min face to face and coordination of care       Scheryl Darter 09/12/2017, 4:32 PM

## 2017-09-13 LAB — CERVICOVAGINAL ANCILLARY ONLY
Bacterial vaginitis: NEGATIVE
Candida vaginitis: POSITIVE — AB
Chlamydia: NEGATIVE
Neisseria Gonorrhea: NEGATIVE
Trichomonas: NEGATIVE

## 2017-09-13 LAB — HEPATITIS C ANTIBODY: Hep C Virus Ab: <0.1 s/co ratio (ref 0.0–0.9)

## 2017-09-13 LAB — HIV ANTIBODY (ROUTINE TESTING W REFLEX): HIV Screen 4th Generation wRfx: NONREACTIVE

## 2017-09-13 LAB — RPR: RPR Ser Ql: NONREACTIVE

## 2017-09-13 LAB — HEPATITIS B SURFACE ANTIGEN: Hepatitis B Surface Ag: NEGATIVE

## 2017-09-14 ENCOUNTER — Other Ambulatory Visit: Payer: Self-pay | Admitting: Obstetrics & Gynecology

## 2017-09-14 MED ORDER — FLUCONAZOLE 150 MG PO TABS: 150.0000 mg | ORAL_TABLET | Freq: Once | ORAL | 0 refills | Status: AC

## 2017-10-21 ENCOUNTER — Other Ambulatory Visit: Payer: Self-pay | Admitting: Obstetrics and Gynecology

## 2017-10-21 ENCOUNTER — Encounter: Payer: Self-pay | Admitting: Obstetrics & Gynecology

## 2017-10-23 ENCOUNTER — Other Ambulatory Visit: Payer: Self-pay | Admitting: Obstetrics and Gynecology

## 2017-10-23 ENCOUNTER — Telehealth: Payer: Self-pay

## 2017-10-23 ENCOUNTER — Other Ambulatory Visit: Payer: Self-pay

## 2017-10-23 MED ORDER — VALACYCLOVIR HCL 1 G PO TABS: 1000.0000 mg | ORAL_TABLET | Freq: Every day | ORAL | 1 refills | Status: DC

## 2017-10-23 MED ORDER — PREPLUS 27-1 MG PO TABS: 1.0000 | ORAL_TABLET | Freq: Every day | ORAL | 0 refills | Status: AC

## 2017-10-23 NOTE — Telephone Encounter (Signed)
Left mess on VM to pick up RX

## 2017-10-23 NOTE — Telephone Encounter (Signed)
Advised patient to scheduled Well Woman Visit and that we will refill Rx until appt.

## 2017-10-31 ENCOUNTER — Ambulatory Visit (INDEPENDENT_AMBULATORY_CARE_PROVIDER_SITE_OTHER): Payer: BLUE CROSS/BLUE SHIELD | Admitting: Obstetrics & Gynecology

## 2017-10-31 ENCOUNTER — Encounter: Payer: Self-pay | Admitting: Obstetrics & Gynecology

## 2017-10-31 VITALS — BP 108/72 | HR 83 | Ht 69.0 in | Wt 146.0 lb

## 2017-10-31 DIAGNOSIS — Z8619 Personal history of other infectious and parasitic diseases: Secondary | ICD-10-CM

## 2017-10-31 DIAGNOSIS — Z309 Encounter for contraceptive management, unspecified: Secondary | ICD-10-CM | POA: Diagnosis not present

## 2017-10-31 DIAGNOSIS — Z30011 Encounter for initial prescription of contraceptive pills: Secondary | ICD-10-CM

## 2017-10-31 MED ORDER — NORETHIN ACE-ETH ESTRAD-FE 1-20 MG-MCG(24) PO TABS: 1.0000 | ORAL_TABLET | Freq: Every day | ORAL | 11 refills | Status: DC

## 2017-10-31 NOTE — Progress Notes (Signed)
Subjective:     Patient ID: Kristin Stokes, female   DOB: 01/31/1996, 21 y.o.   MRN: [id number] Cc: wants to start OCP. Asks about continuation of Valtrex HPIG1P1001 Patient's last menstrual period was 10/28/2016 (approximate). Amenorrheic, still nurses her child at night.She wants to be on OCP and requests LoEstrin.  Past Medical History:  Diagnosis Date  . Genital herpes    Past Surgical History:  Procedure Laterality Date  . ANKLE SURGERY Right    Allergies  Allergen Reactions  . Latex Rash   Social History   Socioeconomic History  . Marital status: Single    Spouse name: Not on file  . Number of children: Not on file  . Years of education: Not on file  . Highest education level: Not on file  Social Needs  . Financial resource strain: Not on file  . Food insecurity - worry: Not on file  . Food insecurity - inability: Not on file  . Transportation needs - medical: Not on file  . Transportation needs - non-medical: Not on file  Occupational History  . Not on file  Tobacco Use  . Smoking status: Never Smoker  . Smokeless tobacco: Never Used  Substance and Sexual Activity  . Alcohol use: No    Comment: Occasionally  . Drug use: No    Comment: Last use of marijuana over 1 month ago.   Marland Kitchen. Sexual activity: Yes    Partners: Male    Birth control/protection: None  Other Topics Concern  . Not on file  Social History Narrative  . Not on file      Review of Systems  Constitutional: Negative.   Respiratory: Negative.   Gastrointestinal: Negative.   Genitourinary: Positive for vaginal pain (vaginal dryness). Negative for vaginal discharge.       Objective:   Physical Exam  Constitutional: She appears well-developed. No distress.  Pulmonary/Chest: Effort normal.  Genitourinary: No vaginal discharge found.  Genitourinary Comments: No lesions, mild atrophy c/w lactation  Psychiatric: She has a normal mood and affect. Her behavior is normal.  Vitals  reviewed.      Assessment:     Long term use of Valtrex can stop Start OCP Vaginal sx c/w decreased estrogen during lactation     Plan:     Loestrin for contraception D/C Valtrex May continue to nurse, child is 5813 mo old  Adam PhenixArnold, Arran Fessel G, MD 10/31/2017

## 2017-10-31 NOTE — Patient Instructions (Signed)
Oral Contraception Information Oral contraceptive pills (OCPs) are medicines taken to prevent pregnancy. OCPs work by preventing the ovaries from releasing eggs. The hormones in OCPs also cause the cervical mucus to thicken, preventing the sperm from entering the uterus. The hormones also cause the uterine lining to become thin, not allowing a fertilized egg to attach to the inside of the uterus. OCPs are highly effective when taken exactly as prescribed. However, OCPs do not prevent sexually transmitted diseases (STDs). Safe sex practices, such as using condoms along with the pill, can help prevent STDs. Before taking the pill, you may have a physical exam and Pap test. Your health care provider may order blood tests. The health care provider will make sure you are a good candidate for oral contraception. Discuss with your health care provider the possible side effects of the OCP you may be prescribed. When starting an OCP, it can take 2 to 3 months for the body to adjust to the changes in hormone levels in your body. Types of oral contraception  The combination pill-This pill contains estrogen and progestin (synthetic progesterone) hormones. The combination pill comes in 21-day, 28-day, or 91-day packs. Some types of combination pills are meant to be taken continuously (365-day pills). With 21-day packs, you do not take pills for 7 days after the last pill. With 28-day packs, the pill is taken every day. The last 7 pills are without hormones. Certain types of pills have more than 21 hormone-containing pills. With 91-day packs, the first 84 pills contain both hormones, and the last 7 pills contain no hormones or contain estrogen only.  The minipill-This pill contains the progesterone hormone only. The pill is taken every day continuously. It is very important to take the pill at the same time each day. The minipill comes in packs of 28 pills. All 28 pills contain the hormone. Advantages of oral  contraceptive pills  Decreases premenstrual symptoms.  Treats menstrual period cramps.  Regulates the menstrual cycle.  Decreases a heavy menstrual flow.  May treatacne, depending on the type of pill.  Treats abnormal uterine bleeding.  Treats polycystic ovarian syndrome.  Treats endometriosis.  Can be used as emergency contraception. Things that can make oral contraceptive pills less effective OCPs can be less effective if:  You forget to take the pill at the same time every day.  You have a stomach or intestinal disease that lessens the absorption of the pill.  You take OCPs with other medicines that make OCPs less effective, such as antibiotics, certain HIV medicines, and some seizure medicines.  You take expired OCPs.  You forget to restart the pill on day 7, when using the packs of 21 pills.  Risks associated with oral contraceptive pills Oral contraceptive pills can sometimes cause side effects, such as:  Headache.  Nausea.  Breast tenderness.  Irregular bleeding or spotting.  Combination pills are also associated with a small increased risk of:  Blood clots.  Heart attack.  Stroke.  This information is not intended to replace advice given to you by your health care provider. Make sure you discuss any questions you have with your health care provider. Document Released: 02/04/2003 Document Revised: 04/21/2016 Document Reviewed: 05/05/2013 Elsevier Interactive Patient Education  2018 Elsevier Inc.  

## 2018-03-12 IMAGING — US US OB TRANSVAGINAL
1 series · 13 of 28 positions shown · non-contrast
Comparison: None.

ADDENDUM:
The measured crown-rump length on this study is approximately 5 mm,
corresponding to an estimated gestational age of approximately 6
weeks. Please see measurement of crown-rump length image 30/140.
CLINICAL DATA: Vaginal bleeding

EXAM:
OBSTETRIC <14 WK US AND TRANSVAGINAL OB US
TECHNIQUE: Both transabdominal and transvaginal ultrasound examinations were
performed for complete evaluation of the gestation as well as the
maternal uterus, adnexal regions, and pelvic cul-de-sac.
Transvaginal technique was performed to assess early pregnancy.

[Series 1: us ob transvaginal · 0.15mm/px · 141 acquisitions, 13 frames shown]
[im 6/141]
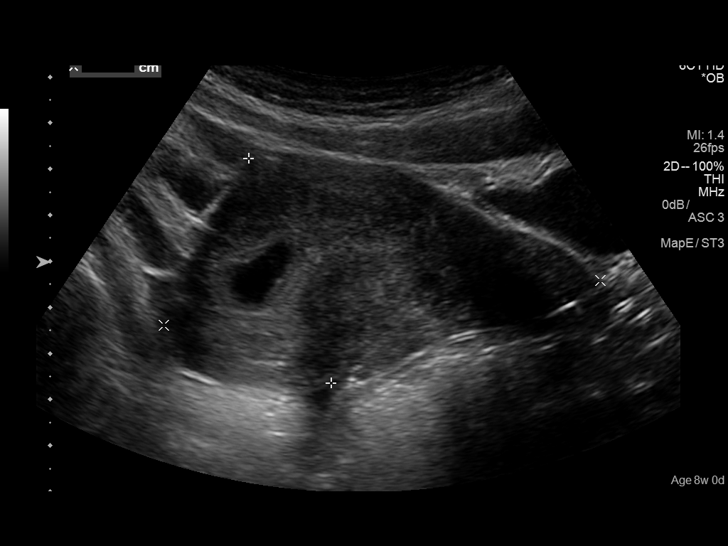
[im 16/141]
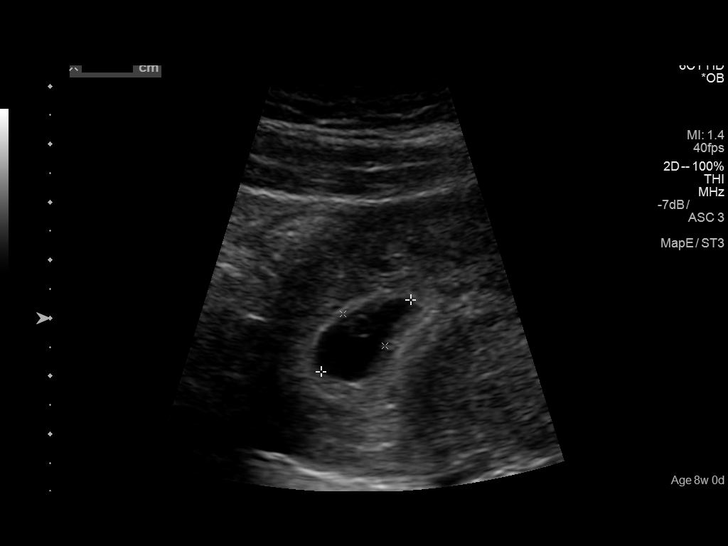
[im 26/141]
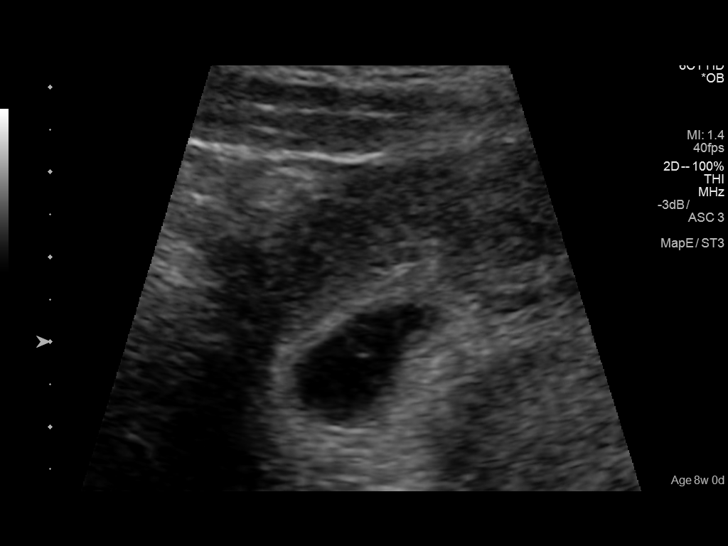
[im 37/141]
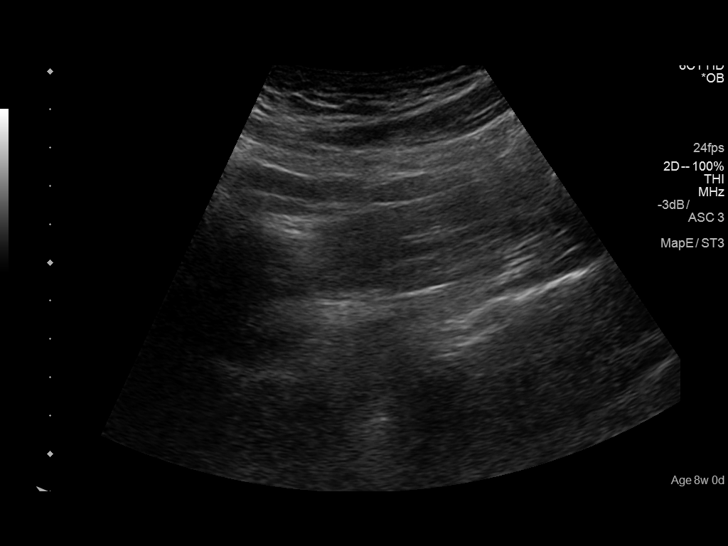
[im 47/141]
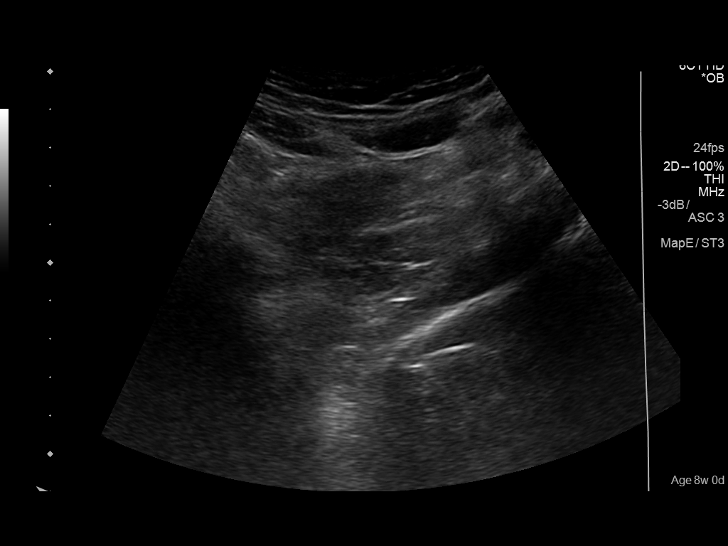
[im 58/141]
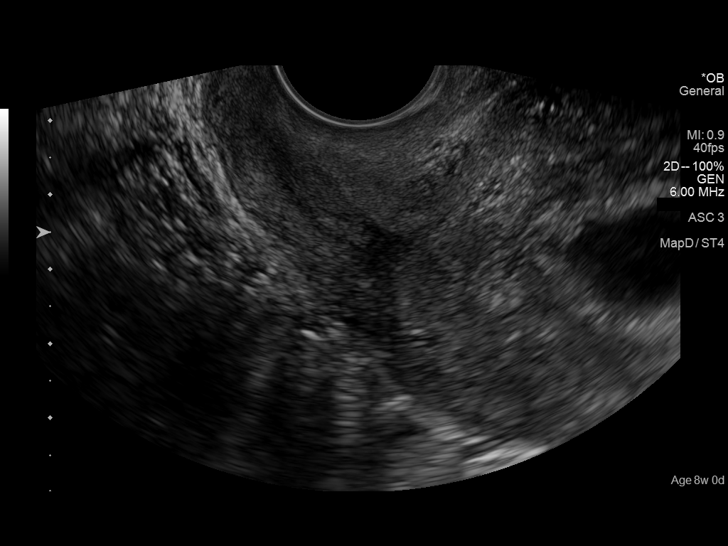
[im 73/141]
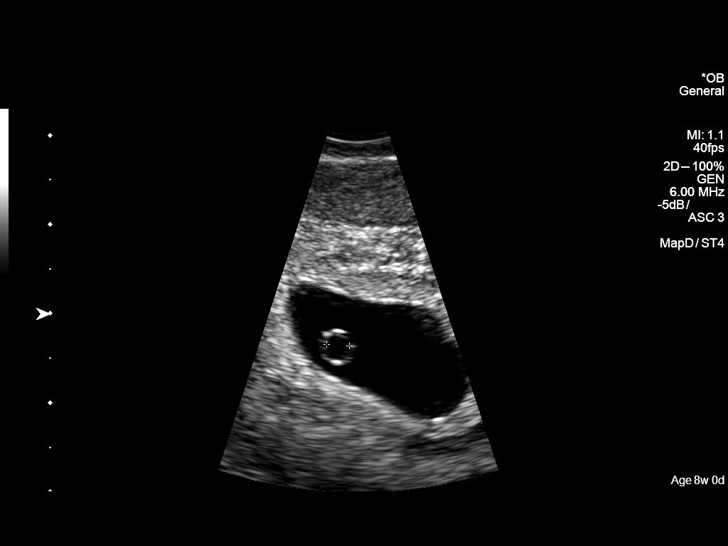
[im 83/141]
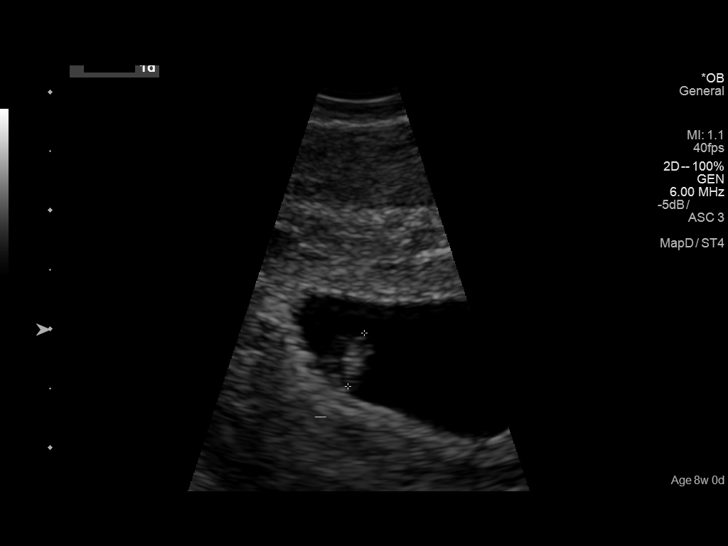
[im 94/141]
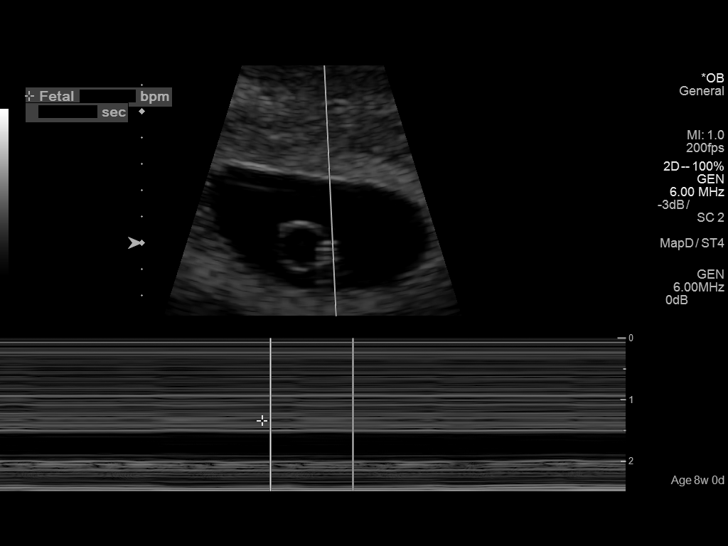
[im 104/141]
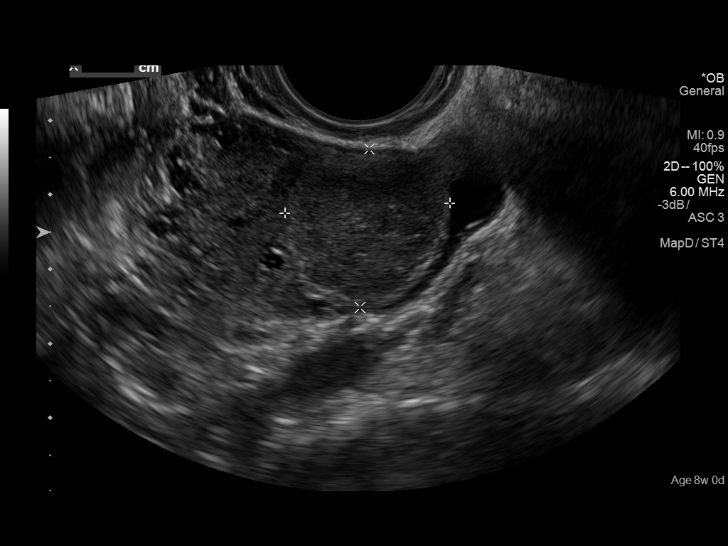
[im 115/141]
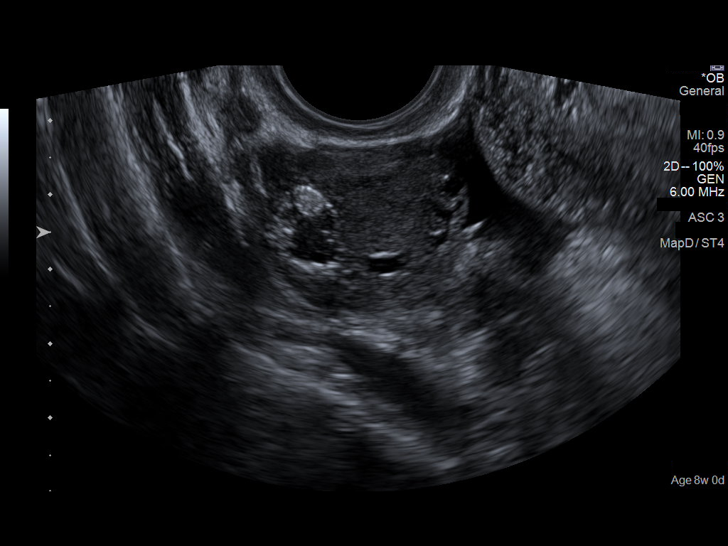
[im 125/141]
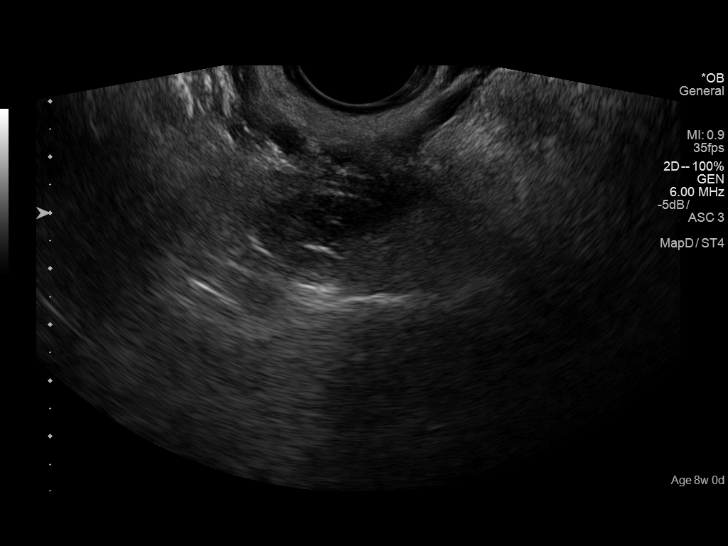
[im 135/141]
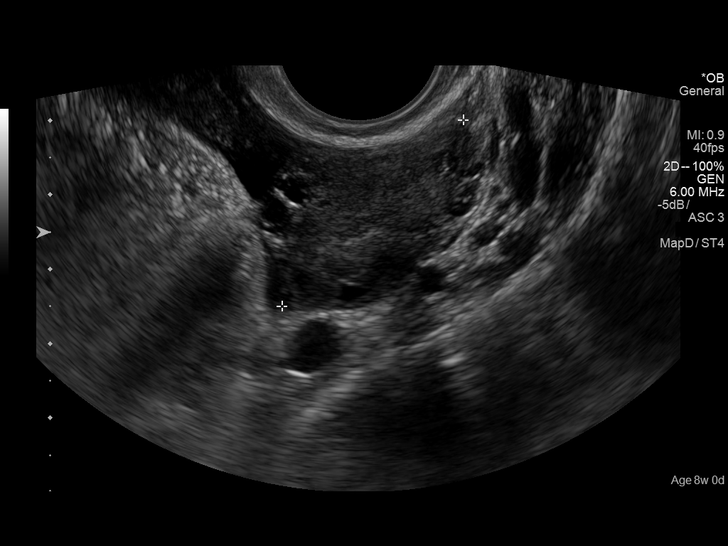

[13 of 28 positions shown; findings below may reference images not displayed]

FINDINGS: Intrauterine gestational sac: Visualized/normal in shape.

Yolk sac:  Visualized

Embryo:  Visualized

Cardiac Activity: Visualized

Heart Rate: 124  bpm

CRL:  5  mm   6 w   1 d       EDC:  September 25, 2016

Subchorionic hemorrhage:  None visualized.

Maternal uterus/adnexae: Cervical os is closed. Left ovary appears
normal. There is a complex cystic structure containing a hyperechoic
focus in the right ovary measuring 2.2 x 2.1 x 2.5 cm. There is
mild-to-moderate surrounding free fluid in the right adnexal region.
IMPRESSION: Single live intrauterine gestation with estimated gestational age of
approximately 6 weeks.

Complex predominantly cystic right adnexal structure with
surrounding fluid. Suspect rupture of corpus luteum with hemorrhage.
Particular attention to this area on subsequent evaluations is
warranted.

## 2018-04-30 IMAGING — US US MFM OB TRANSVAGINAL
1 series · 15 of 22 positions shown · non-contrast
Comparison: none

[Series 1: us mfm ob transvaginal · 22 acquisitions, 15 frames shown]
[im 1/22]
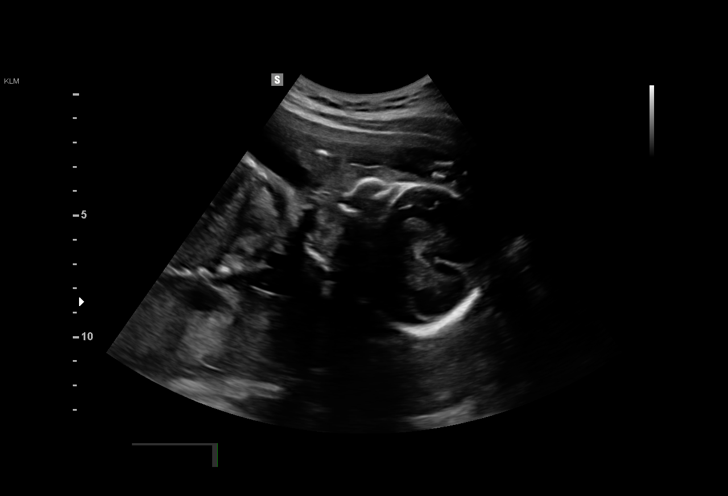
[im 3/22]
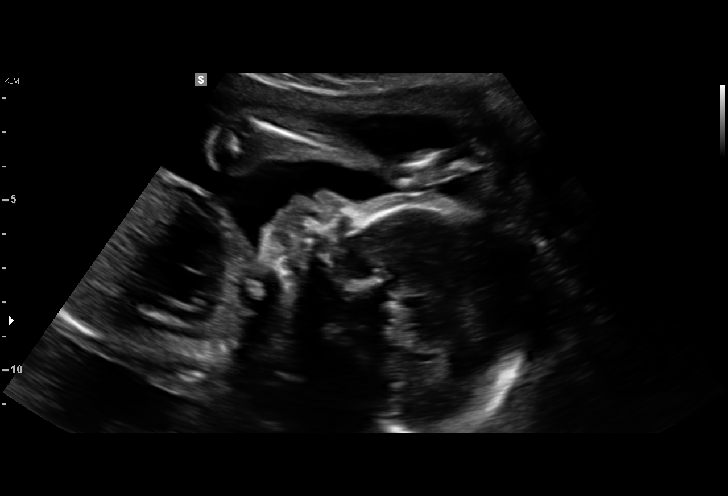
[im 4/22]
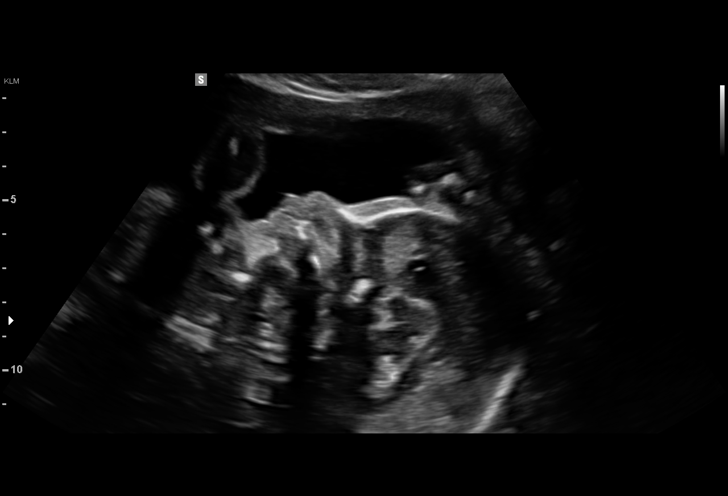
[im 6/22]
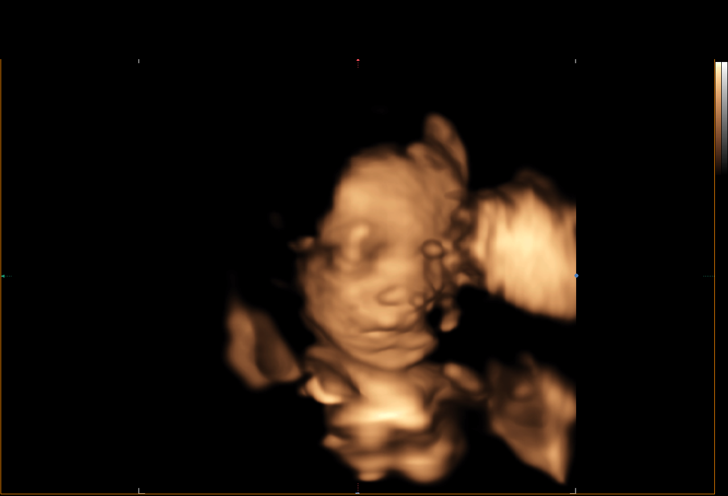
[im 7/22]
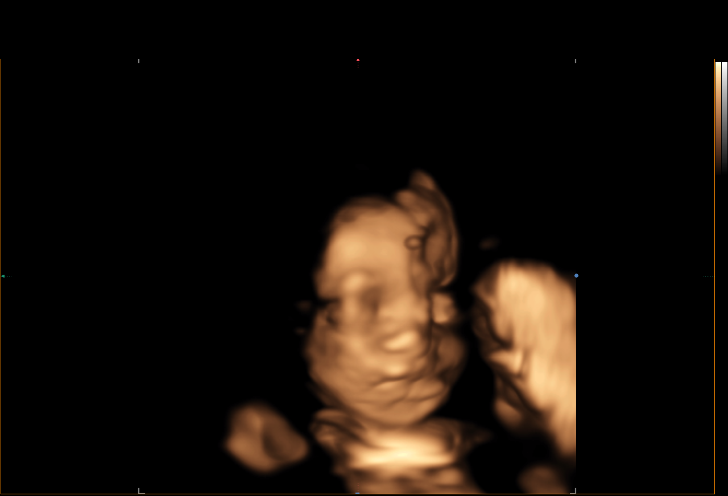
[im 9/22]
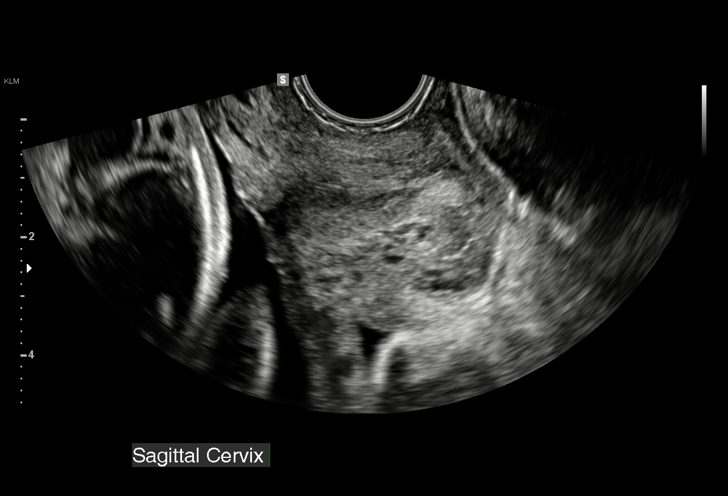
[im 10/22]
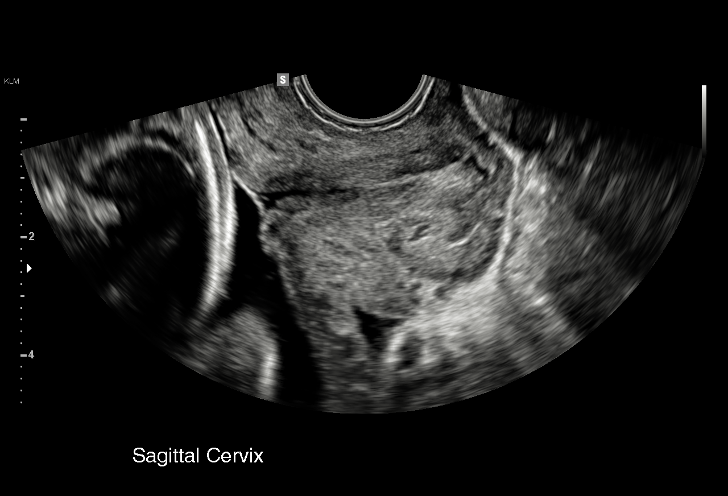
[im 12/22]
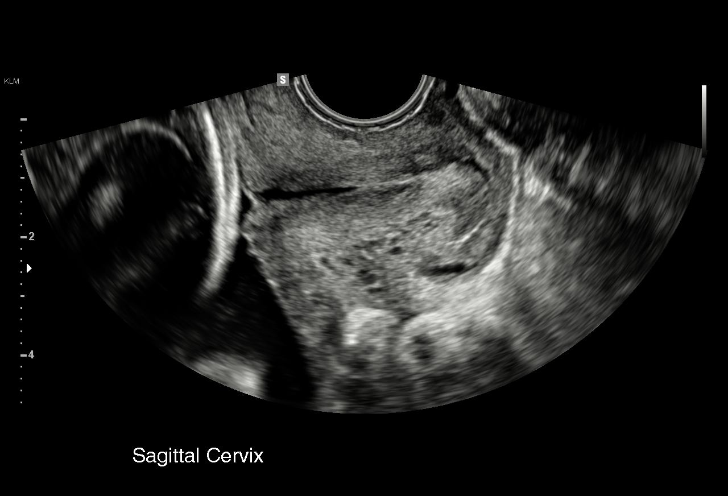
[im 13/22]
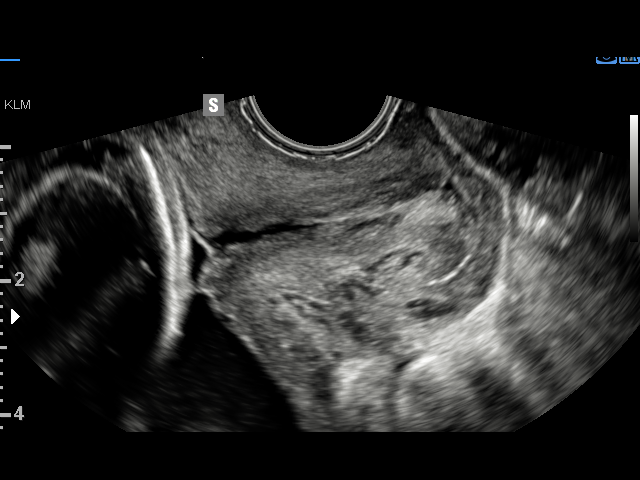
[im 14/22]
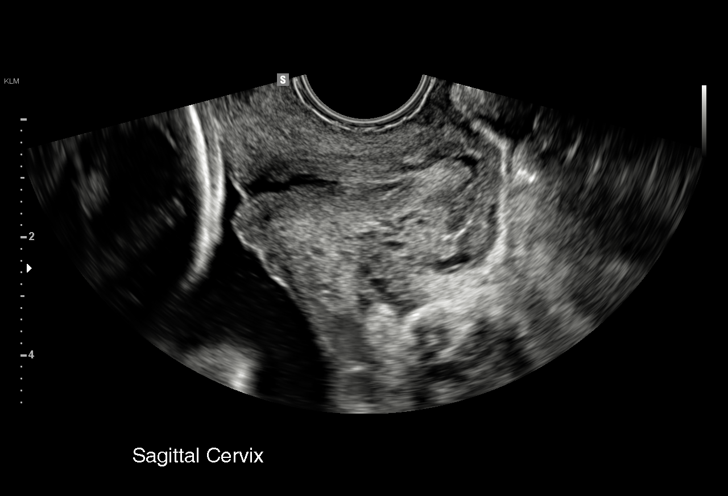
[im 16/22]
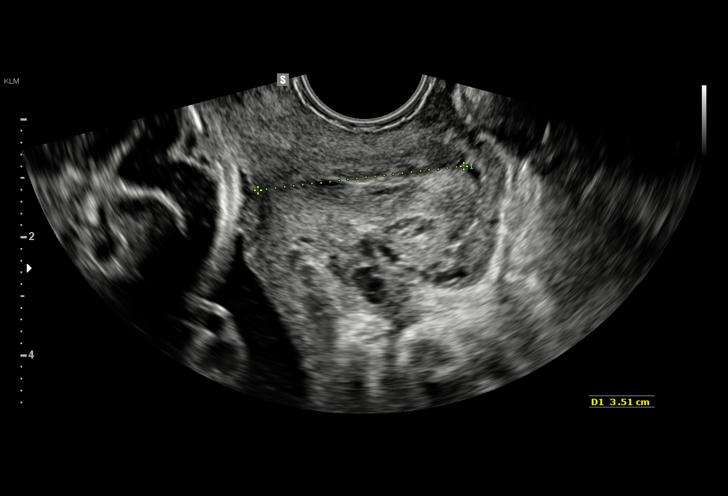
[im 17/22]
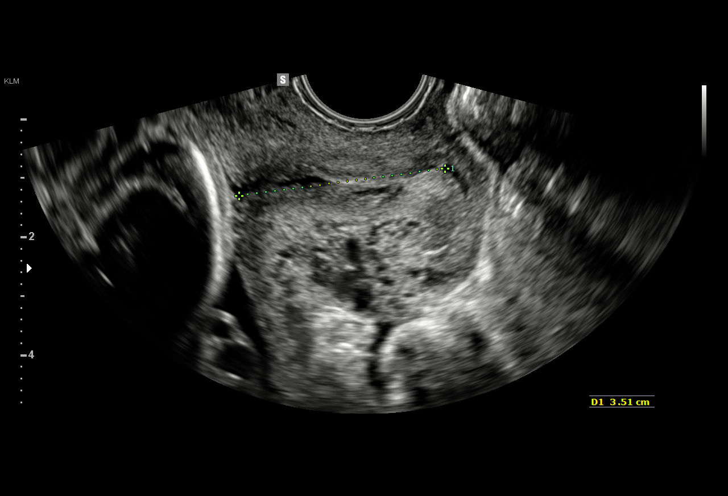
[im 19/22]
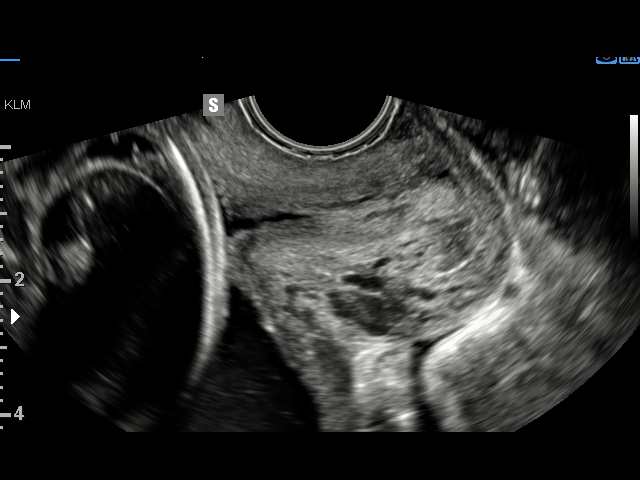
[im 20/22]
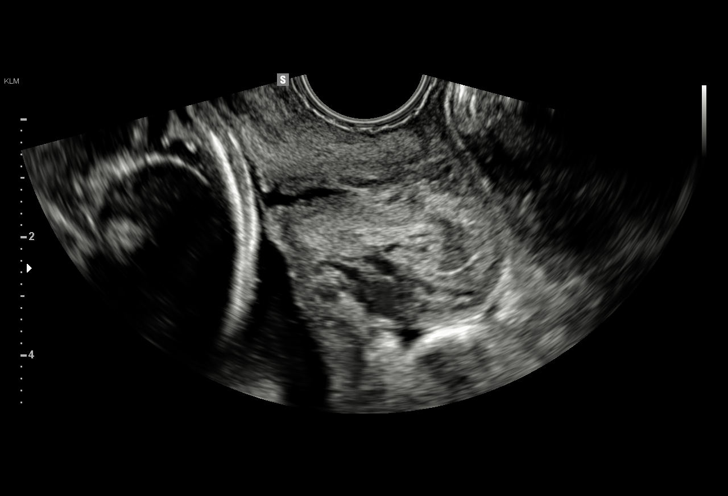
[im 22/22]
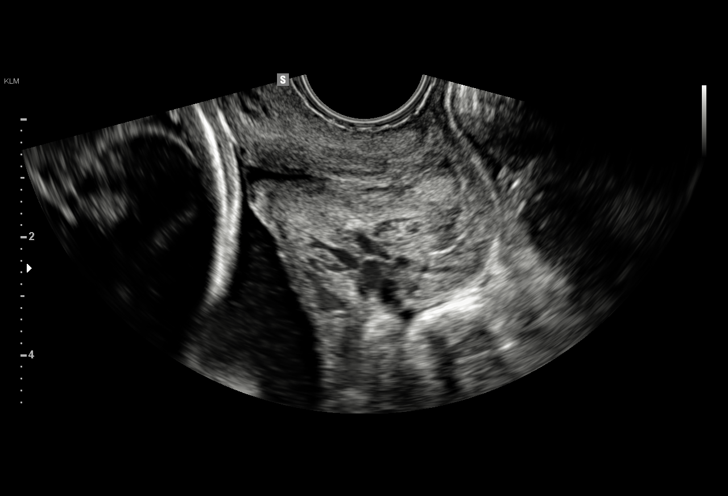

[15 of 22 positions shown; findings below may reference images not displayed]

Road [HOSPITAL]

1  UMI LAUBER            [id number]      [id number]     [id number]
Indications

24 weeks gestation of pregnancy
Cervical shortening complicating pregnancy;
Rachelle Anne
OB History

Blood Type:            Height:  5'9"   Weight (lb):  161      BMI:
Gravidity:    1
Fetal Evaluation

Num Of Fetuses:     1
Fetal Heart         154
Rate(bpm):
Cardiac Activity:   Observed
Presentation:       Cephalic
Gestational Age

Best:          24w 2d    Det. By:   Early Ultrasound         EDD:   10/08/16
(02/14/16)
Cervix Uterus Adnexa

Cervix
Length:            3.5  cm.
Normal appearance by transvaginal scan
Impression

Single IUP at 24w 2d
Limited ultrasound performed for cervical length
TVUS - cervical length 3.5 cm without funneling or dynamic
changes
Normal amniotic fluid volume
Recommendations

Continue vaginal progesterone supplementation
Follow-up ultrasounds as clinically indicated.

## 2018-06-11 ENCOUNTER — Ambulatory Visit (INDEPENDENT_AMBULATORY_CARE_PROVIDER_SITE_OTHER): Payer: BLUE CROSS/BLUE SHIELD

## 2018-06-11 VITALS — BP 109/72 | HR 99 | Ht 69.0 in | Wt 157.4 lb

## 2018-06-11 DIAGNOSIS — Z3201 Encounter for pregnancy test, result positive: Secondary | ICD-10-CM | POA: Diagnosis not present

## 2018-06-11 LAB — POCT URINE PREGNANCY: Preg Test, Ur: POSITIVE — AB

## 2018-06-11 NOTE — Progress Notes (Signed)
Presents for pregnancy test.  UPT today is POSITIVE LMP  04/11/18    EDD  01/16/2019  Ms. Kristin Stokes presents today for UPT. She has no unusual complaints. LMP: 04/11/18 approx.    OBJECTIVE: Appears well, in no apparent distress.   OB History    Gravida  2   Para  1   Term  1   Preterm      AB      Living  1     SAB      TAB      Ectopic      Multiple  0   Live Births  1          Home UPT Result:    NEGATIVE In-Office UPT result: POSITIVE  I have reviewed the patient's medical, obstetrical, social history, and medications.   ASSESSMENT: Positive pregnancy test  PLAN Prenatal care to be completed at: [facility].

## 2018-06-26 ENCOUNTER — Other Ambulatory Visit: Payer: Self-pay

## 2018-06-26 ENCOUNTER — Encounter: Payer: Self-pay | Admitting: Nurse Practitioner

## 2018-06-26 ENCOUNTER — Ambulatory Visit (INDEPENDENT_AMBULATORY_CARE_PROVIDER_SITE_OTHER): Payer: BLUE CROSS/BLUE SHIELD | Admitting: Nurse Practitioner

## 2018-06-26 ENCOUNTER — Other Ambulatory Visit (HOSPITAL_COMMUNITY)
Admission: RE | Admit: 2018-06-26 | Discharge: 2018-06-26 | Disposition: A | Discharge status: Home / Self Care | Payer: BLUE CROSS/BLUE SHIELD | Source: Ambulatory Visit | Attending: Nurse Practitioner | Admitting: Nurse Practitioner

## 2018-06-26 VITALS — BP 101/70 | HR 101 | Temp 98.9°F | Wt 156.4 lb

## 2018-06-26 DIAGNOSIS — Z3480 Encounter for supervision of other normal pregnancy, unspecified trimester: Secondary | ICD-10-CM | POA: Insufficient documentation

## 2018-06-26 DIAGNOSIS — Z8619 Personal history of other infectious and parasitic diseases: Secondary | ICD-10-CM

## 2018-06-26 DIAGNOSIS — Z3481 Encounter for supervision of other normal pregnancy, first trimester: Secondary | ICD-10-CM | POA: Diagnosis not present

## 2018-06-26 DIAGNOSIS — Z3A10 10 weeks gestation of pregnancy: Secondary | ICD-10-CM | POA: Insufficient documentation

## 2018-06-26 DIAGNOSIS — O26879 Cervical shortening, unspecified trimester: Secondary | ICD-10-CM

## 2018-06-26 MED ORDER — VITAFOL GUMMIES 3.33-0.333-34.8 MG PO CHEW: 3.0000 | CHEWABLE_TABLET | Freq: Every day | ORAL | 11 refills | Status: DC

## 2018-06-26 MED ORDER — PRENATAL VITAMINS 0.8 MG PO TABS: 1.0000 | ORAL_TABLET | Freq: Every day | ORAL | 4 refills | Status: DC

## 2018-06-26 NOTE — Progress Notes (Addendum)
Subjective:   Kristin Stokes is a 22 y.o. G2P1001 at 6456w6d by LMP being seen today for her first obstetrical visit.  Her obstetrical history is significant for history of shortened cervix in previous pregnancy with full term delivery and history of HSV (unknown type) with few outbreaks.. Patient does intend to breast feed. She is currently breastfeeding her child that is almost 24 years of age.  Pregnancy history fully reviewed.  Patient reports no complaints.  HISTORY: OB History  Gravida Para Term Preterm AB Living  2 1 1  0 0 1  SAB TAB Ectopic Multiple Live Births  0 0 0 0 1    # Outcome Date GA Lbr Len/2nd Weight Sex Delivery Anes PTL Lv  2 Current           1 Term 10/07/16 1469w6d 08:11 / 01:43 7 lb 7.4 oz (3.385 kg) F Vag-Spont EPI  LIV     Name: Kristin Stokes     Apgar1: 8  Apgar5: 9   Past Medical History:  Diagnosis Date  . Anemia   . Genital herpes    Past Surgical History:  Procedure Laterality Date  . ANKLE SURGERY Right    Family History  Problem Relation Age of Onset  . Asthma Mother   . Anxiety disorder Father   . Hypertension Other   . Diabetes Other   . Cancer Other   . COPD Maternal Grandmother   . Asthma Maternal Grandmother   . Hypertension Maternal Grandmother    Social History   Tobacco Use  . Smoking status: Never Smoker  . Smokeless tobacco: Never Used  Substance Use Topics  . Alcohol use: No    Comment: Occasionally  . Drug use: No    Comment: Last use of marijuana over 1 month ago.    Allergies  Allergen Reactions  . Latex Rash   Current Outpatient Medications on File Prior to Visit  Medication Sig Dispense Refill  . acetaminophen (TYLENOL) 500 MG tablet Take 1,000 mg by mouth every 6 (six) hours as needed for moderate pain.    . valACYclovir (VALTREX) 1000 MG tablet Take 1 tablet (1,000 mg total) by mouth daily. (Patient not taking: Reported on 06/11/2018) 30 tablet 1   No current facility-administered medications  on file prior to visit.      Exam   Vitals:   06/26/18 1322  BP: 101/70  Pulse: (!) 101  Temp: 98.9 F (37.2 C)  Weight: 156 lb 6.4 oz (70.9 kg)   Fetal Heart Rate (bpm): 152  Uterus:  Fundal Height: 10 cm  Pelvic Exam: Perineum: no hemorrhoids, normal perineum   Vulva: normal external genitalia, no lesions   Vagina:  normal mucosa, normal discharge   Cervix: no lesions and normal, pap smear done.    Adnexa: normal adnexa and no mass, fullness, tenderness   Bony Pelvis: average  System: General: well-developed, well-nourished female in no acute distress   Breast:  normal appearance, no masses or tenderness   Skin: normal coloration and turgor, no rashes   Neurologic: oriented, normal, negative, normal mood   Extremities: normal strength, tone, and muscle mass, ROM of all joints is normal   HEENT extraocular movement intact and sclera clear, anicteric   Mouth/Teeth mucous membranes moist, pharynx normal without lesions and dental hygiene good   Neck supple and no masses, normal thyroid   Cardiovascular: regular rate and rhythm   Respiratory:  no respiratory distress, normal breath sounds   Abdomen:  soft, non-tender; no masses,  no organomegaly     Assessment:   Pregnancy: G2P1001 Patient Active Problem List   Diagnosis Date Noted  . Supervision of other normal pregnancy, antepartum 06/26/2018  . History of ELISA positive for HSV 09/22/2016     Plan:  1. Supervision of other normal pregnancy, antepartum Will get OB ultrasound as client had shortened cervix in last pregnancy.  - Enroll Patient in [employer] - Obstetric Panel, Including HIV - Hemoglobinopathy evaluation - Culture, OB Urine - Cytology - PAP - Cervicovaginal ancillary only - Cystic Fibrosis Mutation 97 - Genetic Screening   Initial labs drawn. Continue prenatal vitamins. Genetic Screening discussed, NIPS: ordered. Ultrasound discussed; fetal anatomic survey: discussed but not yet  ordered. Problem list reviewed and updated. The nature of [facility] with multiple MDs and other Advanced Practice Providers was explained to patient; also emphasized that residents, students are part of our team. Routine obstetric precautions reviewed. Return in about 1 month (around 07/27/2018).  Total face-to-face time with patient: 40 minutes.  Over 50% of encounter was spent on counseling and coordination of care.     Nolene Bernheim, FNP Family Nurse Practitioner, [facility] for [employer], [facility] 06/26/2018 2:50 PM   First trimester ultrasound done today and it is >7 days difference in LMP so the best EDC would be by [place].  Dating revised.  EDC 01-07-18  Nolene Bernheim, RN, MSN, NP-BC Nurse Practitioner, [facility] for [employer], [facility] 07/03/2018 12:33 PM

## 2018-06-26 NOTE — Progress Notes (Signed)
NOB.  No complaints today.

## 2018-06-26 NOTE — Addendum Note (Signed)
Addended by: Currie ParisBURLESON, TERRI L on: 06/26/2018 02:33 PM   Modules accepted: Orders

## 2018-06-26 NOTE — Addendum Note (Signed)
Addended by: Currie ParisBURLESON, Dhyan Noah L on: 06/26/2018 02:54 PM   Modules accepted: Orders, SmartSet

## 2018-06-27 LAB — CERVICOVAGINAL ANCILLARY ONLY
Chlamydia: NEGATIVE
Neisseria Gonorrhea: NEGATIVE

## 2018-06-27 LAB — CYTOLOGY - PAP
Diagnosis: NEGATIVE
HPV: NOT DETECTED

## 2018-06-29 LAB — HEMOGLOBINOPATHY EVALUATION
HGB C: 0 %
HGB S: 0 %
HGB VARIANT: 0 %
Hemoglobin A2 Quantitation: 2.4 % (ref 1.8–3.2)
Hemoglobin F Quantitation: 0 % (ref 0.0–2.0)
Hgb A: 97.6 % (ref 96.4–98.8)

## 2018-06-29 LAB — OBSTETRIC PANEL, INCLUDING HIV
Antibody Screen: NEGATIVE
Basophils Absolute: 0 10*3/uL (ref 0.0–0.2)
Basos: 0 %
EOS (ABSOLUTE): 0.1 10*3/uL (ref 0.0–0.4)
Eos: 1 %
HIV Screen 4th Generation wRfx: NONREACTIVE
Hematocrit: 41.6 % (ref 34.0–46.6)
Hemoglobin: 13.5 g/dL (ref 11.1–15.9)
Hepatitis B Surface Ag: NEGATIVE
Immature Grans (Abs): 0 10*3/uL (ref 0.0–0.1)
Immature Granulocytes: 0 %
Lymphocytes Absolute: 2 10*3/uL (ref 0.7–3.1)
Lymphs: 32 %
MCH: 29.1 pg (ref 26.6–33.0)
MCHC: 32.5 g/dL (ref 31.5–35.7)
MCV: 90 fL (ref 79–97)
Monocytes Absolute: 0.3 10*3/uL (ref 0.1–0.9)
Monocytes: 4 %
Neutrophils Absolute: 4 10*3/uL (ref 1.4–7.0)
Neutrophils: 63 %
Platelets: 275 10*3/uL (ref 150–450)
RBC: 4.64 x10E6/uL (ref 3.77–5.28)
RDW: 13.6 % (ref 12.3–15.4)
RPR Ser Ql: NONREACTIVE
Rh Factor: POSITIVE
Rubella Antibodies, IGG: 1.9 index (ref >0.99)
WBC: 6.3 10*3/uL (ref 3.4–10.8)

## 2018-07-01 LAB — CULTURE, OB URINE

## 2018-07-01 LAB — URINE CULTURE, OB REFLEX

## 2018-07-02 ENCOUNTER — Encounter: Payer: Self-pay | Admitting: *Deleted

## 2018-07-03 ENCOUNTER — Other Ambulatory Visit: Payer: Self-pay | Admitting: Nurse Practitioner

## 2018-07-03 ENCOUNTER — Encounter (INDEPENDENT_AMBULATORY_CARE_PROVIDER_SITE_OTHER): Payer: Self-pay

## 2018-07-03 ENCOUNTER — Ambulatory Visit (HOSPITAL_COMMUNITY)
Admission: RE | Admit: 2018-07-03 | Discharge: 2018-07-03 | Disposition: A | Discharge status: Home / Self Care | Payer: BLUE CROSS/BLUE SHIELD | Source: Ambulatory Visit | Attending: Nurse Practitioner | Admitting: Nurse Practitioner

## 2018-07-03 ENCOUNTER — Encounter: Payer: Self-pay | Admitting: Nurse Practitioner

## 2018-07-03 DIAGNOSIS — Z3687 Encounter for antenatal screening for uncertain dates: Secondary | ICD-10-CM

## 2018-07-03 DIAGNOSIS — Z3A13 13 weeks gestation of pregnancy: Secondary | ICD-10-CM | POA: Diagnosis not present

## 2018-07-03 DIAGNOSIS — Z3480 Encounter for supervision of other normal pregnancy, unspecified trimester: Secondary | ICD-10-CM

## 2018-07-03 NOTE — Progress Notes (Signed)
Discussed with ultrasound.  Cervical length evaluation begins at 16 weeks.  Will change order today for ultrasound to confirm dating.  Kristin BernheimERRI Kristin Laplante, RN, MSN, NP-BC Nurse Practitioner, [facility], [facility] 07/03/2018 8:52 AM

## 2018-07-04 LAB — CYSTIC FIBROSIS MUTATION 97: Interpretation: NOT DETECTED

## 2018-07-05 ENCOUNTER — Encounter: Payer: Self-pay | Admitting: Obstetrics & Gynecology

## 2018-07-05 DIAGNOSIS — O09299 Supervision of pregnancy with other poor reproductive or obstetric history, unspecified trimester: Secondary | ICD-10-CM | POA: Insufficient documentation

## 2018-07-24 ENCOUNTER — Ambulatory Visit (INDEPENDENT_AMBULATORY_CARE_PROVIDER_SITE_OTHER): Payer: BLUE CROSS/BLUE SHIELD | Admitting: Advanced Practice Midwife

## 2018-07-24 VITALS — BP 109/67 | HR 98 | Wt 157.0 lb

## 2018-07-24 DIAGNOSIS — O09292 Supervision of pregnancy with other poor reproductive or obstetric history, second trimester: Secondary | ICD-10-CM

## 2018-07-24 DIAGNOSIS — Z3480 Encounter for supervision of other normal pregnancy, unspecified trimester: Secondary | ICD-10-CM

## 2018-07-24 DIAGNOSIS — O09299 Supervision of pregnancy with other poor reproductive or obstetric history, unspecified trimester: Secondary | ICD-10-CM

## 2018-07-24 NOTE — Patient Instructions (Signed)

## 2018-07-24 NOTE — Progress Notes (Signed)
Pt is G2P1 6658w1d here for [name] visit.

## 2018-07-24 NOTE — Progress Notes (Signed)
   PRENATAL VISIT NOTE  Subjective:  Kristin Stokes is a 22 y.o. G2P1001 at 5321w1d being seen today for ongoing prenatal care.  She is currently monitored for the following issues for this low-risk pregnancy and has Genital herpes; Supervision of other normal pregnancy, antepartum; and History of prior pregnancy with short cervix, currently pregnant on their problem list.  Patient reports no complaints.  Contractions: Not present. Vag. Bleeding: None.  Movement: Absent. Denies leaking of fluid.   The following portions of the patient's history were reviewed and updated as appropriate: allergies, current medications, past family history, past medical history, past social history, past surgical history and problem list. Problem list updated.  Objective:   Vitals:   07/24/18 1605  BP: 109/67  Pulse: 98  Weight: 71.2 kg    Fetal Status: Fetal Heart Rate (bpm): 160   Movement: Absent     General:  Alert, oriented and cooperative. Patient is in no acute distress.  Skin: Skin is warm and dry. No rash noted.   Cardiovascular: Normal heart rate noted  Respiratory: Normal respiratory effort, no problems with respiration noted  Abdomen: Soft, gravid, appropriate for gestational age.  Pain/Pressure: Absent     Pelvic: Cervical exam deferred        Extremities: Normal range of motion.  Edema: None  Mental Status: Normal mood and affect. Normal behavior. Normal judgment and thought content.   Assessment and Plan:  Pregnancy: G2P1001 at 5421w1d  1. Supervision of other normal pregnancy, antepartum --Anticipatory guidance about next visits/weeks of pregnancy given. - AFP, Serum, Open Spina Bifida - US MFM OB COMP + 14 WK; Future  2. History of prior pregnancy with short cervix, currently pregnant --Serial US for cervical length. --US scheduled 8/29 with MFM.  Preterm labor symptoms and general obstetric precautions including but not limited to vaginal bleeding, contractions, leaking of  fluid and fetal movement were reviewed in detail with the patient. Please refer to After Visit Summary for other counseling recommendations.  No follow-ups on file.  Future Appointments  Date Time Provider Department Center  07/26/2018  3:45 PM [facility] US 5 [facility]    Sharen CounterLisa Leftwich-Kirby, CNM

## 2018-07-26 ENCOUNTER — Encounter (HOSPITAL_COMMUNITY): Payer: Self-pay

## 2018-07-26 ENCOUNTER — Ambulatory Visit (HOSPITAL_COMMUNITY)
Admission: RE | Admit: 2018-07-26 | Discharge: 2018-07-26 | Disposition: A | Discharge status: Home / Self Care | Payer: BLUE CROSS/BLUE SHIELD | Source: Ambulatory Visit | Attending: Nurse Practitioner | Admitting: Nurse Practitioner

## 2018-07-26 DIAGNOSIS — O3432 Maternal care for cervical incompetence, second trimester: Secondary | ICD-10-CM

## 2018-07-26 DIAGNOSIS — Z3A16 16 weeks gestation of pregnancy: Secondary | ICD-10-CM

## 2018-07-26 DIAGNOSIS — Z3480 Encounter for supervision of other normal pregnancy, unspecified trimester: Secondary | ICD-10-CM | POA: Diagnosis present

## 2018-07-26 DIAGNOSIS — Z3686 Encounter for antenatal screening for cervical length: Secondary | ICD-10-CM | POA: Diagnosis not present

## 2018-07-26 DIAGNOSIS — Z3687 Encounter for antenatal screening for uncertain dates: Secondary | ICD-10-CM | POA: Diagnosis not present

## 2018-07-27 ENCOUNTER — Other Ambulatory Visit (HOSPITAL_COMMUNITY): Payer: Self-pay | Admitting: *Deleted

## 2018-07-27 DIAGNOSIS — O3430 Maternal care for cervical incompetence, unspecified trimester: Secondary | ICD-10-CM

## 2018-08-15 ENCOUNTER — Ambulatory Visit (HOSPITAL_COMMUNITY): Payer: BLUE CROSS/BLUE SHIELD

## 2018-08-21 ENCOUNTER — Ambulatory Visit (INDEPENDENT_AMBULATORY_CARE_PROVIDER_SITE_OTHER): Payer: BLUE CROSS/BLUE SHIELD | Admitting: Nurse Practitioner

## 2018-08-21 VITALS — BP 116/72 | HR 76 | Wt 161.4 lb

## 2018-08-21 DIAGNOSIS — Z3480 Encounter for supervision of other normal pregnancy, unspecified trimester: Secondary | ICD-10-CM

## 2018-08-21 DIAGNOSIS — O09299 Supervision of pregnancy with other poor reproductive or obstetric history, unspecified trimester: Secondary | ICD-10-CM

## 2018-08-21 NOTE — Addendum Note (Signed)
Addended by: Currie ParisBURLESON, Ritha Sampedro L on: 08/21/2018 04:41 PM   Modules accepted: Kipp BroodSmartSet

## 2018-08-21 NOTE — Progress Notes (Signed)
    Subjective:  Kristin InchesJazzmyne Stokes is a 22 y.o. G2P1001 at 8637w1d being seen today for ongoing prenatal care.  She is currently monitored for the following issues for this low-risk pregnancy and has Genital herpes; Supervision of other normal pregnancy, antepartum; and History of prior pregnancy with short cervix, currently pregnant on their problem list.  Patient reports having some tail bone pain when sitting for longer periods.  Contractions: Not present. Vag. Bleeding: None.  Movement: Present. Denies leaking of fluid.   The following portions of the patient's history were reviewed and updated as appropriate: allergies, current medications, past family history, past medical history, past social history, past surgical history and problem list. Problem list updated.  Objective:   Vitals:   08/21/18 1622  BP: 116/72  Pulse: 76  Weight: 161 lb 6.4 oz (73.2 kg)    Fetal Status: Fetal Heart Rate (bpm): 155   Movement: Present     General:  Alert, oriented and cooperative. Patient is in no acute distress.  Skin: Skin is warm and dry. No rash noted.   Cardiovascular: Normal heart rate noted  Respiratory: Normal respiratory effort, no problems with respiration noted  Abdomen: Soft, gravid, appropriate for gestational age. Pain/Pressure: Absent     Pelvic:  Cervical exam deferred        Extremities: Normal range of motion.  Edema: None  Mental Status: Normal mood and affect. Normal behavior. Normal judgment and thought content.  No bruising seen on upper gluteal fold - no hyperpigmentation. No area of point tenderness.   Urinalysis:      Assessment and Plan:  Pregnancy: G2P1001 at 1437w1d  1. Supervision of other normal pregnancy, antepartum Discussed stretching exercises to use in case the pain is coming from muscle tightness.  To notify the office if it is worsening. Has anatomy ultrasound scheduled.  Preterm labor symptoms and general obstetric precautions including but not limited  to vaginal bleeding, contractions, leaking of fluid and fetal movement were reviewed in detail with the patient. Please refer to After Visit Summary for other counseling recommendations.  Return in about 4 weeks (around 09/18/2018).  Nolene BernheimERRI Kaicee Scarpino, RN, MSN, NP-BC Nurse Practitioner, [facility] [employer], [facility] 08/21/2018 4:35 PM

## 2018-08-21 NOTE — Patient Instructions (Signed)
Second Trimester of Pregnancy The second trimester is from week 13 through week 28, month 4 through 6. This is often the time in pregnancy that you feel your best. Often times, morning sickness has lessened or quit. You may have more energy, and you may get hungry more often. Your unborn baby (fetus) is growing rapidly. At the end of the sixth month, he or she is about 9 inches long and weighs about 1 pounds. You will likely feel the baby move (quickening) between 18 and 20 weeks of pregnancy. Follow these instructions at home:  Avoid all smoking, herbs, and alcohol. Avoid drugs not approved by your doctor.  Do not use any tobacco products, including cigarettes, chewing tobacco, and electronic cigarettes. If you need help quitting, ask your doctor. You may get counseling or other support to help you quit.  Only take medicine as told by your doctor. Some medicines are safe and some are not during pregnancy.  Exercise only as told by your doctor. Stop exercising if you start having cramps.  Eat regular, healthy meals.  Wear a good support bra if your breasts are tender.  Do not use hot tubs, steam rooms, or saunas.  Wear your seat belt when driving.  Avoid raw meat, uncooked cheese, and liter boxes and soil used by cats.  Take your prenatal vitamins.  Take 1500-2000 milligrams of calcium daily starting at the 20th week of pregnancy until you deliver your baby.  Try taking medicine that helps you poop (stool softener) as needed, and if your doctor approves. Eat more fiber by eating fresh fruit, vegetables, and whole grains. Drink enough fluids to keep your pee (urine) clear or pale yellow.  Take warm water baths (sitz baths) to soothe pain or discomfort caused by hemorrhoids. Use hemorrhoid cream if your doctor approves.  If you have puffy, bulging veins (varicose veins), wear support hose. Raise (elevate) your feet for 15 minutes, 3-4 times a day. Limit salt in your diet.  Avoid heavy  lifting, wear low heals, and sit up straight.  Rest with your legs raised if you have leg cramps or low back pain.  Visit your dentist if you have not gone during your pregnancy. Use a soft toothbrush to brush your teeth. Be gentle when you floss.  You can have sex (intercourse) unless your doctor tells you not to.  Go to your doctor visits. Get help if:  You feel dizzy.  You have mild cramps or pressure in your lower belly (abdomen).  You have a nagging pain in your belly area.  You continue to feel sick to your stomach (nauseous), throw up (vomit), or have watery poop (diarrhea).  You have bad smelling fluid coming from your vagina.  You have pain with peeing (urination). Get help right away if:  You have a fever.  You are leaking fluid from your vagina.  You have spotting or bleeding from your vagina.  You have severe belly cramping or pain.  You lose or gain weight rapidly.  You have trouble catching your breath and have chest pain.  You notice sudden or extreme puffiness (swelling) of your face, hands, ankles, feet, or legs.  You have not felt the baby move in over an hour.  You have severe headaches that do not go away with medicine.  You have vision changes. This information is not intended to replace advice given to you by your health care provider. Make sure you discuss any questions you have with your health care   provider. Document Released: 02/08/2010 Document Revised: 04/21/2016 Document Reviewed: 01/15/2013 Elsevier Interactive Patient Education  2017 Elsevier Inc.  

## 2018-08-21 NOTE — Progress Notes (Signed)
Pt is here for [name]. G2P1 6233w1d.

## 2018-08-22 ENCOUNTER — Encounter: Payer: BLUE CROSS/BLUE SHIELD | Admitting: Nurse Practitioner

## 2018-08-27 ENCOUNTER — Ambulatory Visit (HOSPITAL_COMMUNITY)
Admission: RE | Admit: 2018-08-27 | Discharge: 2018-08-27 | Disposition: A | Discharge status: Home / Self Care | Payer: BLUE CROSS/BLUE SHIELD | Source: Ambulatory Visit | Attending: Advanced Practice Midwife | Admitting: Advanced Practice Midwife

## 2018-08-27 DIAGNOSIS — Z363 Encounter for antenatal screening for malformations: Secondary | ICD-10-CM | POA: Diagnosis not present

## 2018-08-27 DIAGNOSIS — Z3686 Encounter for antenatal screening for cervical length: Secondary | ICD-10-CM | POA: Diagnosis not present

## 2018-08-27 DIAGNOSIS — O3430 Maternal care for cervical incompetence, unspecified trimester: Secondary | ICD-10-CM

## 2018-08-27 DIAGNOSIS — Z3A21 21 weeks gestation of pregnancy: Secondary | ICD-10-CM

## 2018-08-27 DIAGNOSIS — O3432 Maternal care for cervical incompetence, second trimester: Secondary | ICD-10-CM | POA: Diagnosis present

## 2018-08-27 DIAGNOSIS — Z3480 Encounter for supervision of other normal pregnancy, unspecified trimester: Secondary | ICD-10-CM

## 2018-09-17 ENCOUNTER — Ambulatory Visit (INDEPENDENT_AMBULATORY_CARE_PROVIDER_SITE_OTHER): Payer: BLUE CROSS/BLUE SHIELD | Admitting: Advanced Practice Midwife

## 2018-09-17 ENCOUNTER — Encounter: Payer: Self-pay | Admitting: Advanced Practice Midwife

## 2018-09-17 VITALS — BP 111/68 | HR 103 | Wt 168.4 lb

## 2018-09-17 DIAGNOSIS — Z3482 Encounter for supervision of other normal pregnancy, second trimester: Secondary | ICD-10-CM

## 2018-09-17 DIAGNOSIS — O26872 Cervical shortening, second trimester: Secondary | ICD-10-CM

## 2018-09-17 DIAGNOSIS — Z3480 Encounter for supervision of other normal pregnancy, unspecified trimester: Secondary | ICD-10-CM

## 2018-09-17 DIAGNOSIS — O26879 Cervical shortening, unspecified trimester: Secondary | ICD-10-CM

## 2018-09-17 DIAGNOSIS — B373 Candidiasis of vulva and vagina: Secondary | ICD-10-CM

## 2018-09-17 DIAGNOSIS — B3731 Acute candidiasis of vulva and vagina: Secondary | ICD-10-CM

## 2018-09-17 MED ORDER — TERCONAZOLE 0.4 % VA CREA: 1.0000 | TOPICAL_CREAM | Freq: Every day | VAGINAL | 0 refills | Status: DC

## 2018-09-17 MED ORDER — VALACYCLOVIR HCL 1 G PO TABS: 1000.0000 mg | ORAL_TABLET | Freq: Every day | ORAL | 2 refills | Status: DC

## 2018-09-17 NOTE — Patient Instructions (Signed)

## 2018-09-17 NOTE — Progress Notes (Signed)
   PRENATAL VISIT NOTE  Subjective:  Kristin Stokes is a 22 y.o. G2P1001 at [redacted]w[redacted]d being seen today for ongoing prenatal care.  She is currently monitored for the following issues for this low-risk pregnancy and has Genital herpes; Supervision of other normal pregnancy, antepartum; and History of prior pregnancy with short cervix, currently pregnant on their problem list.  Patient reports no complaints.  Contractions: Not present. Vag. Bleeding: None.  Movement: Present. Denies leaking of fluid.   The following portions of the patient's history were reviewed and updated as appropriate: allergies, current medications, past family history, past medical history, past social history, past surgical history and problem list. Problem list updated.  Objective:   Vitals:   09/17/18 1606  BP: 111/68  Pulse: (!) 103  Weight: 76.4 kg    Fetal Status: Fetal Heart Rate (bpm): 160   Movement: Present     General:  Alert, oriented and cooperative. Patient is in no acute distress.  Skin: Skin is warm and dry. No rash noted.   Cardiovascular: Normal heart rate noted  Respiratory: Normal respiratory effort, no problems with respiration noted  Abdomen: Soft, gravid, appropriate for gestational age.  Pain/Pressure: Absent     Pelvic: On visual inspection, white vaginal discharge at introitus, no lesions noted, no pain on exam  Extremities: Normal range of motion.  Edema: None  Mental Status: Normal mood and affect. Normal behavior. Normal judgment and thought content.   Assessment and Plan:  Pregnancy: G2P1001 at [redacted]w[redacted]d  1. Supervision of other normal pregnancy, antepartum --Anticipatory guidance about next visits/weeks of pregnancy given.  2. Cervical shortening affecting pregnancy --Hx term delivery. Cervical length normal at anatomy scan 08/27/18, no further follow up recommended.  3. Vaginal candidiasis --Pt with hx genital HSV, is worried that itching/irritation could be outbreak. No  evidence of outbreak on exam, white discharge c/w yeast. - terconazole (TERAZOL 7) 0.4 % vaginal cream; Place 1 applicator vaginally at bedtime.  Dispense: 45 g; Refill: 0 --Rx for Valtrex 1000 mg PO daily x 5 days in case symptoms develop into HSV outbreak    Preterm labor symptoms and general obstetric precautions including but not limited to vaginal bleeding, contractions, leaking of fluid and fetal movement were reviewed in detail with the patient. Please refer to After Visit Summary for other counseling recommendations.  No follow-ups on file.  No future appointments.  Sharen Counter, CNM

## 2018-10-15 ENCOUNTER — Other Ambulatory Visit: Payer: BLUE CROSS/BLUE SHIELD

## 2018-10-15 ENCOUNTER — Encounter: Payer: BLUE CROSS/BLUE SHIELD | Admitting: Obstetrics and Gynecology

## 2018-10-24 ENCOUNTER — Other Ambulatory Visit: Payer: BLUE CROSS/BLUE SHIELD

## 2018-10-24 DIAGNOSIS — Z3480 Encounter for supervision of other normal pregnancy, unspecified trimester: Secondary | ICD-10-CM

## 2018-10-25 LAB — CBC
Hematocrit: 36 % (ref 34.0–46.6)
Hemoglobin: 12.5 g/dL (ref 11.1–15.9)
MCH: 31.6 pg (ref 26.6–33.0)
MCHC: 34.7 g/dL (ref 31.5–35.7)
MCV: 91 fL (ref 79–97)
Platelets: 269 10*3/uL (ref 150–450)
RBC: 3.96 x10E6/uL (ref 3.77–5.28)
RDW: 13 % (ref 12.3–15.4)
WBC: 8 10*3/uL (ref 3.4–10.8)

## 2018-10-25 LAB — RPR: RPR Ser Ql: NONREACTIVE

## 2018-10-25 LAB — GLUCOSE TOLERANCE, 2 HOURS W/ 1HR
Glucose, 1 hour: 94 mg/dL (ref 65–179)
Glucose, 2 hour: 99 mg/dL (ref 65–152)
Glucose, Fasting: 72 mg/dL (ref 65–91)

## 2018-10-25 LAB — HIV ANTIBODY (ROUTINE TESTING W REFLEX): HIV Screen 4th Generation wRfx: NONREACTIVE

## 2018-11-01 ENCOUNTER — Ambulatory Visit (INDEPENDENT_AMBULATORY_CARE_PROVIDER_SITE_OTHER): Payer: BLUE CROSS/BLUE SHIELD | Admitting: Certified Nurse Midwife

## 2018-11-01 ENCOUNTER — Encounter: Payer: Self-pay | Admitting: Certified Nurse Midwife

## 2018-11-01 VITALS — BP 107/67 | HR 114 | Wt 177.8 lb

## 2018-11-01 DIAGNOSIS — Z3480 Encounter for supervision of other normal pregnancy, unspecified trimester: Secondary | ICD-10-CM

## 2018-11-01 DIAGNOSIS — A6000 Herpesviral infection of urogenital system, unspecified: Secondary | ICD-10-CM

## 2018-11-01 DIAGNOSIS — Z3A30 30 weeks gestation of pregnancy: Secondary | ICD-10-CM

## 2018-11-01 DIAGNOSIS — Z3483 Encounter for supervision of other normal pregnancy, third trimester: Secondary | ICD-10-CM

## 2018-11-01 NOTE — Progress Notes (Signed)
   PRENATAL VISIT NOTE  Subjective:  Kristin Stokes is a 22 y.o. G2P1001 at 7150w3d being seen today for ongoing prenatal care.  She is currently monitored for the following issues for this low-risk pregnancy and has Genital herpes; Supervision of other normal pregnancy, antepartum; and History of prior pregnancy with short cervix, currently pregnant on their problem list.  Patient reports no complaints.  Contractions: Not present. Vag. Bleeding: None.  Movement: Present. Denies leaking of fluid.   The following portions of the patient's history were reviewed and updated as appropriate: allergies, current medications, past family history, past medical history, past social history, past surgical history and problem list. Problem list updated.  Objective:   Vitals:   11/01/18 1626  BP: 107/67  Pulse: (!) 114  Weight: 177 lb 12.8 oz (80.6 kg)    Fetal Status: Fetal Heart Rate (bpm): 145 Fundal Height: 31 cm Movement: Present     General:  Alert, oriented and cooperative. Patient is in no acute distress.  Skin: Skin is warm and dry. No rash noted.   Cardiovascular: Normal heart rate noted  Respiratory: Normal respiratory effort, no problems with respiration noted  Abdomen: Soft, gravid, appropriate for gestational age.  Pain/Pressure: Absent     Pelvic: Cervical exam deferred        Extremities: Normal range of motion.  Edema: None  Mental Status: Normal mood and affect. Normal behavior. Normal judgment and thought content.   Assessment and Plan:  Pregnancy: G2P1001 at 6250w3d  1. Supervision of other normal pregnancy, antepartum - Patient doing well, no complaints  - Anticipatory guidance  - Interested in waterbirth, discussed completion of class and obtain waterbirth materials in order to be able to have, patient verbalizes understanding and will bring certificate after class is complete.   2. Genital herpes simplex, unspecified site - Suppression at 35 weeks   Preterm labor  symptoms and general obstetric precautions including but not limited to vaginal bleeding, contractions, leaking of fluid and fetal movement were reviewed in detail with the patient. Please refer to After Visit Summary for other counseling recommendations.  Return in about 2 weeks (around 11/15/2018) for [name].  Future Appointments  Date Time Provider Department Center  11/14/2018  4:00 PM Kristin Stokes, Kristin L, MD [facility] None    Kristin Stokes, CNM

## 2018-11-01 NOTE — Progress Notes (Signed)
Pt presents for [name]. Pt has no other concerns. 

## 2018-11-01 NOTE — Patient Instructions (Signed)

## 2018-11-14 ENCOUNTER — Encounter: Payer: BLUE CROSS/BLUE SHIELD | Admitting: Obstetrics and Gynecology

## 2018-11-16 ENCOUNTER — Telehealth: Payer: Self-pay | Admitting: Obstetrics and Gynecology

## 2018-11-28 NOTE — L&D Delivery Note (Addendum)
Delivery Note At 4:17 PM a viable female was delivered via Vaginal, Spontaneous (Presentation: vertex; LOA).  APGAR: 9, 9; weight: PENDING.   Placenta status: delivered spontaneously, intact. Cord: 3-vessel with the following complications: none.  Cord pH: N/A  Anesthesia:  Epidural Episiotomy: None Lacerations: 1st degree;Perineal Suture Repair: None Est. Blood Loss (mL): 105  Mom to postpartum.  Baby to Couplet care / Skin to Skin.  Kristin Stokes 01/01/2019, 4:41 PM   The patient was noted to be complete and pushing. Patient noted to have epidural anesthesia.   The patient was asked to push and the head delivered spontaneously in the LOW position, over an intact perineum. A loose nuchal cord was unable to be relieved so baby was delivered through Somersault maneuver.   The anterior (right) shoulder delivered easily and the posterior shoulder followed. The remainder of the infant was easily delivered and placed on mom's chest skin-to-skin where nursing staff were in attendance. The infant was noted to have spontaneous cry and movement of all 4 extremities. The oropharynx and nasopharynx were bulb suctioned. After a 1-minute delay the cord was clamped x 2 and cut by father of the baby.   Cord blood was then obtained.   The placenta delivered intact spontaneously. Pitocin was started IV to firm the uterus.   Examination of the cervix and vaginal vault did not reveal any lacerations. Examination of the perineum showed a 0.5cm 1st-degree laceration that was hemostatic and required no repair.  A vaginal pack was then placed.  All sponge and needle counts were correct. Kristin Stokes, CNM, was present for the entire procedure.    I was gloved and present for entire delivery SVD without incident Loose nuchal managed via Somersault without difficulty No difficulty with shoulders Lacerations as listed above Repair not indicated  Kristin Stokes, CNM 01/01/19  5:32  PM

## 2018-12-03 ENCOUNTER — Ambulatory Visit (INDEPENDENT_AMBULATORY_CARE_PROVIDER_SITE_OTHER): Payer: BLUE CROSS/BLUE SHIELD | Admitting: Advanced Practice Midwife

## 2018-12-03 ENCOUNTER — Encounter: Payer: Self-pay | Admitting: Advanced Practice Midwife

## 2018-12-03 VITALS — BP 104/68 | HR 106 | Wt 184.3 lb

## 2018-12-03 DIAGNOSIS — Z3483 Encounter for supervision of other normal pregnancy, third trimester: Secondary | ICD-10-CM

## 2018-12-03 DIAGNOSIS — A6000 Herpesviral infection of urogenital system, unspecified: Secondary | ICD-10-CM

## 2018-12-03 DIAGNOSIS — M549 Dorsalgia, unspecified: Secondary | ICD-10-CM

## 2018-12-03 DIAGNOSIS — O99891 Other specified diseases and conditions complicating pregnancy: Secondary | ICD-10-CM

## 2018-12-03 DIAGNOSIS — O9989 Other specified diseases and conditions complicating pregnancy, childbirth and the puerperium: Secondary | ICD-10-CM

## 2018-12-03 DIAGNOSIS — Z3480 Encounter for supervision of other normal pregnancy, unspecified trimester: Secondary | ICD-10-CM

## 2018-12-03 MED ORDER — VALACYCLOVIR HCL 500 MG PO TABS: 500.0000 mg | ORAL_TABLET | Freq: Two times a day (BID) | ORAL | 1 refills | Status: DC

## 2018-12-03 MED ORDER — COMFORT FIT MATERNITY SUPP MED MISC: 1.0000 | Freq: Every day | 0 refills | Status: DC

## 2018-12-03 NOTE — Progress Notes (Signed)
   PRENATAL VISIT NOTE  Subjective:  Kristin Stokes is a 23 y.o. G2P1001 at [redacted]w[redacted]d being seen today for ongoing prenatal care.  She is currently monitored for the following issues for this low-risk pregnancy and has Genital herpes; Supervision of other normal pregnancy, antepartum; and History of prior pregnancy with short cervix, currently pregnant on their problem list.  Patient reports backache.  Contractions: Irritability. Vag. Bleeding: None.  Movement: Present. Denies leaking of fluid.   The following portions of the patient's history were reviewed and updated as appropriate: allergies, current medications, past family history, past medical history, past social history, past surgical history and problem list. Problem list updated.  Objective:   Vitals:   12/03/18 1530  BP: 104/68  Pulse: (!) 106  Weight: 83.6 kg    Fetal Status: Fetal Heart Rate (bpm): 142 Fundal Height: 35 cm Movement: Present     General:  Alert, oriented and cooperative. Patient is in no acute distress.  Skin: Skin is warm and dry. No rash noted.   Cardiovascular: Normal heart rate noted  Respiratory: Normal respiratory effort, no problems with respiration noted  Abdomen: Soft, gravid, appropriate for gestational age.  Pain/Pressure: Absent     Pelvic: Cervical exam deferred        Extremities: Normal range of motion.  Edema: Trace  Mental Status: Normal mood and affect. Normal behavior. Normal judgment and thought content.   Assessment and Plan:  Pregnancy: G2P1001 at [redacted]w[redacted]d  1. Supervision of other normal pregnancy, antepartum --Anticipatory guidance about next visits/weeks of pregnancy given. --Plans waterbirth, taking class next week.  2. Genital herpes simplex, unspecified site --Rx for Valtrex 500 mg BID to start now.  3. Back pain affecting pregnancy in third trimester --Rest/ice/heat/warm bath/Tylenol PRN - Elastic Bandages & Supports (COMFORT FIT MATERNITY SUPP MED) MISC; 1 Device by Does  not apply route daily.  Dispense: 1 each; Refill: 0  Preterm labor symptoms and general obstetric precautions including but not limited to vaginal bleeding, contractions, leaking of fluid and fetal movement were reviewed in detail with the patient. Please refer to After Visit Summary for other counseling recommendations.  Return in about 2 weeks (around 12/17/2018).  Future Appointments  Date Time Provider Department Center  12/17/2018  3:45 PM Left[place]-Kirby, Wilmer Floor, CNM [facility] None    Sharen Counter, CNM

## 2018-12-03 NOTE — Progress Notes (Signed)
CC: Backpain

## 2018-12-17 ENCOUNTER — Ambulatory Visit (INDEPENDENT_AMBULATORY_CARE_PROVIDER_SITE_OTHER): Payer: Medicaid Other | Admitting: Advanced Practice Midwife

## 2018-12-17 ENCOUNTER — Other Ambulatory Visit (HOSPITAL_COMMUNITY)
Admission: RE | Admit: 2018-12-17 | Discharge: 2018-12-17 | Disposition: A | Discharge status: Home / Self Care | Payer: BC Managed Care – PPO | Source: Ambulatory Visit | Attending: Advanced Practice Midwife | Admitting: Advanced Practice Midwife

## 2018-12-17 VITALS — BP 109/72 | HR 116 | Wt 181.0 lb

## 2018-12-17 DIAGNOSIS — R6889 Other general symptoms and signs: Secondary | ICD-10-CM

## 2018-12-17 DIAGNOSIS — Z3A37 37 weeks gestation of pregnancy: Secondary | ICD-10-CM

## 2018-12-17 DIAGNOSIS — Z3480 Encounter for supervision of other normal pregnancy, unspecified trimester: Secondary | ICD-10-CM | POA: Insufficient documentation

## 2018-12-17 DIAGNOSIS — A6000 Herpesviral infection of urogenital system, unspecified: Secondary | ICD-10-CM

## 2018-12-17 DIAGNOSIS — Z3483 Encounter for supervision of other normal pregnancy, third trimester: Secondary | ICD-10-CM

## 2018-12-17 DIAGNOSIS — O26893 Other specified pregnancy related conditions, third trimester: Secondary | ICD-10-CM

## 2018-12-17 DIAGNOSIS — R102 Pelvic and perineal pain: Secondary | ICD-10-CM

## 2018-12-17 NOTE — Patient Instructions (Signed)

## 2018-12-17 NOTE — Addendum Note (Signed)
Addended by: Sharen Counter A on: 12/17/2018 04:45 PM   Modules accepted: Orders

## 2018-12-17 NOTE — Progress Notes (Addendum)
   PRENATAL VISIT NOTE  Subjective:  Kristin Stokes is a 23 y.o. G2P1001 at [redacted]w[redacted]d being seen today for ongoing prenatal care.  She is currently monitored for the following issues for this low-risk pregnancy and has Genital herpes; Supervision of other normal pregnancy, antepartum; and History of prior pregnancy with short cervix, currently pregnant on their problem list.  Patient reports pelvic pain/soreness.  Contractions: Irregular. Vag. Bleeding: None.  Movement: Present. Denies leaking of fluid.   The following portions of the patient's history were reviewed and updated as appropriate: allergies, current medications, past family history, past medical history, past social history, past surgical history and problem list. Problem list updated.  Objective:   Vitals:   12/17/18 1600  BP: 109/72  Pulse: (!) 116  Weight: 82.1 kg    Fetal Status: Fetal Heart Rate (bpm): 140 Fundal Height: 37 cm Movement: Present  Presentation: Vertex  General:  Alert, oriented and cooperative. Patient is in no acute distress.  Skin: Skin is warm and dry. No rash noted.   Cardiovascular: Normal heart rate noted  Respiratory: Normal respiratory effort, no problems with respiration noted  Abdomen: Soft, gravid, appropriate for gestational age.  Pain/Pressure: Present     Pelvic: Cervical exam performed Dilation: 1 Effacement (%): 50 Station: -1  Extremities: Normal range of motion.     Mental Status: Normal mood and affect. Normal behavior. Normal judgment and thought content.   Assessment and Plan:  Pregnancy: G2P1001 at [redacted]w[redacted]d  1. Supervision of other normal pregnancy, antepartum --Anticipatory guidance about next visits/weeks of pregnancy given. - Strep Gp B NAA - Cervicovaginal ancillary only( [facility] [place])  3. Influenza-like symptoms --URI started yesterday.  Congestion, rhinorrhea, cough.  Fever of 101 last night, none today.   --Note for pt to miss work tomorrow.  If flu positive, will  extend all week. - Influenza a and b  4. Pelvic pain affecting pregnancy in third trimester, antepartum --Cervix 1/50/-1 station low but cervix posterior. No regular contractions. --Reviewed signs of labor/reasons to seek care. --Urine culture today.  Term labor symptoms and general obstetric precautions including but not limited to vaginal bleeding, contractions, leaking of fluid and fetal movement were reviewed in detail with the patient. Please refer to After Visit Summary for other counseling recommendations.  No follow-ups on file.  No future appointments.  Sharen Counter, CNM

## 2018-12-18 ENCOUNTER — Telehealth: Payer: Self-pay | Admitting: Advanced Practice Midwife

## 2018-12-18 LAB — INFLUENZA A AND B
Influenza A Ag, EIA: NEGATIVE
Influenza B Ag, EIA: NEGATIVE

## 2018-12-18 LAB — CERVICOVAGINAL ANCILLARY ONLY
Chlamydia: NEGATIVE
Neisseria Gonorrhea: NEGATIVE

## 2018-12-18 LAB — PLEASE NOTE:

## 2018-12-18 NOTE — Telephone Encounter (Signed)
Called pt to notify her of negative influenza PCR on 12/17/18.  Pt reports she feels better, no fevers in 24+ hours.  Only fever was 12/16/18 in the evening.  No body aches or n/v.  Pt has improved but still present congestion and rhinorrhea.    With negative PCR for flu and minimal symptoms, will treat conservatively. Pt to increase PO fluids, take safe OTC medications in pregnancy for symptom management.  Pt to return to work tomorrow. If fever, body aches, or worsening symptoms develop, please seek primary care or call the office.

## 2018-12-19 LAB — CULTURE, OB URINE

## 2018-12-19 LAB — URINE CULTURE, OB REFLEX: Organism ID, Bacteria: NO GROWTH

## 2018-12-19 LAB — STREP GP B NAA: Strep Gp B NAA: POSITIVE — AB

## 2018-12-23 ENCOUNTER — Encounter: Payer: Self-pay | Admitting: Advanced Practice Midwife

## 2018-12-23 DIAGNOSIS — O99820 Streptococcus B carrier state complicating pregnancy: Secondary | ICD-10-CM | POA: Insufficient documentation

## 2018-12-31 ENCOUNTER — Encounter: Payer: Self-pay | Admitting: Obstetrics and Gynecology

## 2018-12-31 ENCOUNTER — Other Ambulatory Visit: Payer: Self-pay | Admitting: Obstetrics and Gynecology

## 2018-12-31 ENCOUNTER — Ambulatory Visit (INDEPENDENT_AMBULATORY_CARE_PROVIDER_SITE_OTHER): Payer: Medicaid Other | Admitting: Obstetrics and Gynecology

## 2018-12-31 VITALS — BP 101/67 | HR 92 | Wt 184.7 lb

## 2018-12-31 DIAGNOSIS — Z3480 Encounter for supervision of other normal pregnancy, unspecified trimester: Secondary | ICD-10-CM

## 2018-12-31 DIAGNOSIS — O99820 Streptococcus B carrier state complicating pregnancy: Secondary | ICD-10-CM

## 2018-12-31 DIAGNOSIS — Z3483 Encounter for supervision of other normal pregnancy, third trimester: Secondary | ICD-10-CM

## 2018-12-31 DIAGNOSIS — A6000 Herpesviral infection of urogenital system, unspecified: Secondary | ICD-10-CM

## 2018-12-31 NOTE — Progress Notes (Signed)
   PRENATAL VISIT NOTE  Subjective:  Kristin Stokes is a 23 y.o. G2P1001 at [redacted]w[redacted]d being seen today for ongoing prenatal care.  She is currently monitored for the following issues for this low-risk pregnancy and has Genital herpes; Supervision of other normal pregnancy, antepartum; History of prior pregnancy with short cervix, currently pregnant; and GBS (group B Streptococcus carrier), +RV culture, currently pregnant on their problem list.  Patient reports no complaints.  Contractions: Irregular. Vag. Bleeding: None.  Movement: Present. Denies leaking of fluid.   The following portions of the patient's history were reviewed and updated as appropriate: allergies, current medications, past family history, past medical history, past social history, past surgical history and problem list. Problem list updated.  Objective:   Vitals:   12/31/18 1613  BP: 101/67  Pulse: 92  Weight: 184 lb 11.2 oz (83.8 kg)    Fetal Status: Fetal Heart Rate (bpm): 150 Fundal Height: 37 cm Movement: Present  Presentation: Vertex  General:  Alert, oriented and cooperative. Patient is in no acute distress.  Skin: Skin is warm and dry. No rash noted.   Cardiovascular: Normal heart rate noted  Respiratory: Normal respiratory effort, no problems with respiration noted  Abdomen: Soft, gravid, appropriate for gestational age.  Pain/Pressure: Present     Pelvic: Cervical exam performed Dilation: 3 Effacement (%): 70 Station: -2  Extremities: Normal range of motion.  Edema: None  Mental Status: Normal mood and affect. Normal behavior. Normal judgment and thought content.   Assessment and Plan:  Pregnancy: G2P1001 at [redacted]w[redacted]d  1. Supervision of other normal pregnancy, antepartum Patient is doing well without complaints IOl scheduled at 41 weeks  2. GBS (group B Streptococcus carrier), +RV culture, currently pregnant Will receive prophylaxis in labor  3. Genital herpes simplex, unspecified site Continue  suppression  Term labor symptoms and general obstetric precautions including but not limited to vaginal bleeding, contractions, leaking of fluid and fetal movement were reviewed in detail with the patient. Please refer to After Visit Summary for other counseling recommendations.  Return in about 1 week (around 01/07/2019) for [name].  No future appointments.  Catalina Antigua, MD

## 2018-12-31 NOTE — Progress Notes (Signed)
Pt is here for [name]. [redacted]w[redacted]d.  

## 2019-01-01 ENCOUNTER — Inpatient Hospital Stay (HOSPITAL_COMMUNITY)
Admission: AD | Admit: 2019-01-01 | Discharge: 2019-01-02 | DRG: 806 | Disposition: A | Discharge status: Home / Self Care | Payer: BC Managed Care – PPO | Attending: Family Medicine | Admitting: Family Medicine

## 2019-01-01 ENCOUNTER — Other Ambulatory Visit: Payer: Self-pay

## 2019-01-01 ENCOUNTER — Inpatient Hospital Stay (HOSPITAL_COMMUNITY): Payer: BC Managed Care – PPO | Admitting: Anesthesiology

## 2019-01-01 ENCOUNTER — Encounter (HOSPITAL_COMMUNITY): Payer: Self-pay

## 2019-01-01 DIAGNOSIS — A6000 Herpesviral infection of urogenital system, unspecified: Secondary | ICD-10-CM | POA: Diagnosis present

## 2019-01-01 DIAGNOSIS — O99824 Streptococcus B carrier state complicating childbirth: Secondary | ICD-10-CM | POA: Diagnosis not present

## 2019-01-01 DIAGNOSIS — O9902 Anemia complicating childbirth: Secondary | ICD-10-CM | POA: Diagnosis present

## 2019-01-01 DIAGNOSIS — D649 Anemia, unspecified: Secondary | ICD-10-CM | POA: Diagnosis present

## 2019-01-01 DIAGNOSIS — Z3483 Encounter for supervision of other normal pregnancy, third trimester: Secondary | ICD-10-CM | POA: Diagnosis present

## 2019-01-01 DIAGNOSIS — O99820 Streptococcus B carrier state complicating pregnancy: Secondary | ICD-10-CM

## 2019-01-01 DIAGNOSIS — O9832 Other infections with a predominantly sexual mode of transmission complicating childbirth: Secondary | ICD-10-CM | POA: Diagnosis present

## 2019-01-01 DIAGNOSIS — Z3A39 39 weeks gestation of pregnancy: Secondary | ICD-10-CM | POA: Diagnosis not present

## 2019-01-01 LAB — TYPE AND SCREEN
ABO/RH(D): O POS
Antibody Screen: NEGATIVE

## 2019-01-01 LAB — CBC
HCT: 39.7 % (ref 36.0–46.0)
Hemoglobin: 14.1 g/dL (ref 12.0–15.0)
MCH: 31.6 pg (ref 26.0–34.0)
MCHC: 35.5 g/dL (ref 30.0–36.0)
MCV: 89 fL (ref 80.0–100.0)
Platelets: 339 10*3/uL (ref 150–400)
RBC: 4.46 MIL/uL (ref 3.87–5.11)
RDW: 13.1 % (ref 11.5–15.5)
WBC: 15.3 10*3/uL — ABNORMAL HIGH (ref 4.0–10.5)
nRBC: 0 % (ref 0.0–0.2)

## 2019-01-01 LAB — RPR: RPR Ser Ql: NONREACTIVE

## 2019-01-01 MED ORDER — EPHEDRINE 5 MG/ML INJ
10.0000 mg | INTRAVENOUS | Status: DC | PRN
  Filled 2019-01-01: qty 2

## 2019-01-01 MED ORDER — OXYCODONE-ACETAMINOPHEN 5-325 MG PO TABS: 2.0000 | ORAL_TABLET | ORAL | Status: DC | PRN

## 2019-01-01 MED ORDER — ONDANSETRON HCL 4 MG PO TABS: 4.0000 mg | ORAL_TABLET | ORAL | Status: DC | PRN

## 2019-01-01 MED ORDER — PHENYLEPHRINE 40 MCG/ML (10ML) SYRINGE FOR IV PUSH (FOR BLOOD PRESSURE SUPPORT)
80.0000 ug | PREFILLED_SYRINGE | INTRAVENOUS | Status: DC | PRN
  Filled 2019-01-01: qty 10

## 2019-01-01 MED ORDER — FLEET ENEMA 7-19 GM/118ML RE ENEM: 1.0000 | ENEMA | RECTAL | Status: DC | PRN

## 2019-01-01 MED ORDER — SOD CITRATE-CITRIC ACID 500-334 MG/5ML PO SOLN: 30.0000 mL | ORAL | Status: DC | PRN

## 2019-01-01 MED ORDER — WITCH HAZEL-GLYCERIN EX PADS: 1.0000 "application " | MEDICATED_PAD | CUTANEOUS | Status: DC | PRN

## 2019-01-01 MED ORDER — OXYCODONE-ACETAMINOPHEN 5-325 MG PO TABS: 1.0000 | ORAL_TABLET | ORAL | Status: DC | PRN

## 2019-01-01 MED ORDER — ACETAMINOPHEN 325 MG PO TABS
650.0000 mg | ORAL_TABLET | ORAL | Status: DC | PRN
  Administered 2019-01-02 (×2): 650 mg via ORAL
  Filled 2019-01-01 (×2): qty 2

## 2019-01-01 MED ORDER — PHENYLEPHRINE 40 MCG/ML (10ML) SYRINGE FOR IV PUSH (FOR BLOOD PRESSURE SUPPORT)
80.0000 ug | PREFILLED_SYRINGE | INTRAVENOUS | Status: DC | PRN
  Filled 2019-01-01 (×2): qty 10

## 2019-01-01 MED ORDER — FENTANYL 2.5 MCG/ML BUPIVACAINE 1/10 % EPIDURAL INFUSION (WH - ANES)
14.0000 mL/h | INTRAMUSCULAR | Status: DC | PRN
  Administered 2019-01-01: 14 mL/h via EPIDURAL
  Filled 2019-01-01: qty 100

## 2019-01-01 MED ORDER — BENZOCAINE-MENTHOL 20-0.5 % EX AERO: 1.0000 "application " | INHALATION_SPRAY | CUTANEOUS | Status: DC | PRN

## 2019-01-01 MED ORDER — DIBUCAINE 1 % RE OINT: 1.0000 "application " | TOPICAL_OINTMENT | RECTAL | Status: DC | PRN

## 2019-01-01 MED ORDER — OXYTOCIN 40 UNITS IN NORMAL SALINE INFUSION - SIMPLE MED
2.5000 [IU]/h | INTRAVENOUS | Status: DC
  Administered 2019-01-01: 2.5 [IU]/h via INTRAVENOUS
  Filled 2019-01-01: qty 1000

## 2019-01-01 MED ORDER — DIPHENHYDRAMINE HCL 50 MG/ML IJ SOLN: 12.5000 mg | INTRAMUSCULAR | Status: DC | PRN

## 2019-01-01 MED ORDER — LIDOCAINE HCL (PF) 1 % IJ SOLN
30.0000 mL | INTRAMUSCULAR | Status: DC | PRN
  Filled 2019-01-01: qty 30

## 2019-01-01 MED ORDER — SIMETHICONE 80 MG PO CHEW: 80.0000 mg | CHEWABLE_TABLET | ORAL | Status: DC | PRN

## 2019-01-01 MED ORDER — LACTATED RINGERS IV SOLN
500.0000 mL | Freq: Once | INTRAVENOUS | Status: AC
  Administered 2019-01-01: 500 mL via INTRAVENOUS

## 2019-01-01 MED ORDER — IBUPROFEN 600 MG PO TABS
600.0000 mg | ORAL_TABLET | Freq: Four times a day (QID) | ORAL | Status: DC
  Administered 2019-01-01 – 2019-01-02 (×4): 600 mg via ORAL
  Filled 2019-01-01 (×4): qty 1

## 2019-01-01 MED ORDER — OXYTOCIN BOLUS FROM INFUSION
500.0000 mL | Freq: Once | INTRAVENOUS | Status: AC
  Administered 2019-01-01: 500 mL via INTRAVENOUS

## 2019-01-01 MED ORDER — ONDANSETRON HCL 4 MG/2ML IJ SOLN: 4.0000 mg | INTRAMUSCULAR | Status: DC | PRN

## 2019-01-01 MED ORDER — ZOLPIDEM TARTRATE 5 MG PO TABS: 5.0000 mg | ORAL_TABLET | Freq: Every evening | ORAL | Status: DC | PRN

## 2019-01-01 MED ORDER — LIDOCAINE HCL (PF) 1 % IJ SOLN
INTRAMUSCULAR | Status: DC | PRN
  Administered 2019-01-01: 6 mL via EPIDURAL
  Administered 2019-01-01: 7 mL via EPIDURAL

## 2019-01-01 MED ORDER — ONDANSETRON HCL 4 MG/2ML IJ SOLN: 4.0000 mg | Freq: Four times a day (QID) | INTRAMUSCULAR | Status: DC | PRN

## 2019-01-01 MED ORDER — FENTANYL CITRATE (PF) 100 MCG/2ML IJ SOLN
INTRAMUSCULAR | Status: AC
  Filled 2019-01-01: qty 2

## 2019-01-01 MED ORDER — DIPHENHYDRAMINE HCL 25 MG PO CAPS: 25.0000 mg | ORAL_CAPSULE | Freq: Four times a day (QID) | ORAL | Status: DC | PRN

## 2019-01-01 MED ORDER — SENNOSIDES-DOCUSATE SODIUM 8.6-50 MG PO TABS
2.0000 | ORAL_TABLET | ORAL | Status: DC
  Administered 2019-01-01: 2 via ORAL
  Filled 2019-01-01: qty 2

## 2019-01-01 MED ORDER — LACTATED RINGERS IV SOLN
INTRAVENOUS | Status: DC
  Administered 2019-01-01 (×2): via INTRAVENOUS

## 2019-01-01 MED ORDER — PENICILLIN G 3 MILLION UNITS IVPB - SIMPLE MED
3.0000 10*6.[IU] | INTRAVENOUS | Status: DC
  Administered 2019-01-01: 3 10*6.[IU] via INTRAVENOUS
  Filled 2019-01-01 (×3): qty 100

## 2019-01-01 MED ORDER — PRENATAL MULTIVITAMIN CH
1.0000 | ORAL_TABLET | Freq: Every day | ORAL | Status: DC
  Administered 2019-01-02: 1 via ORAL
  Filled 2019-01-01: qty 1

## 2019-01-01 MED ORDER — ACETAMINOPHEN 325 MG PO TABS: 650.0000 mg | ORAL_TABLET | ORAL | Status: DC | PRN

## 2019-01-01 MED ORDER — LACTATED RINGERS IV SOLN: 500.0000 mL | INTRAVENOUS | Status: DC | PRN

## 2019-01-01 MED ORDER — OXYCODONE HCL 5 MG PO TABS: 5.0000 mg | ORAL_TABLET | ORAL | Status: DC | PRN

## 2019-01-01 MED ORDER — FENTANYL CITRATE (PF) 100 MCG/2ML IJ SOLN
100.0000 ug | Freq: Once | INTRAMUSCULAR | Status: AC
  Administered 2019-01-01: 100 ug via INTRAVENOUS

## 2019-01-01 MED ORDER — SODIUM CHLORIDE 0.9 % IV SOLN
5.0000 10*6.[IU] | Freq: Once | INTRAVENOUS | Status: AC
  Administered 2019-01-01: 5 10*6.[IU] via INTRAVENOUS
  Filled 2019-01-01: qty 5

## 2019-01-01 MED ORDER — COCONUT OIL OIL: 1.0000 "application " | TOPICAL_OIL | Status: DC | PRN

## 2019-01-01 MED ORDER — TETANUS-DIPHTH-ACELL PERTUSSIS 5-2.5-18.5 LF-MCG/0.5 IM SUSP: 0.5000 mL | Freq: Once | INTRAMUSCULAR | Status: DC

## 2019-01-01 NOTE — MAU Note (Signed)
Kristin Stokes is a 23 y.o. at [redacted]w[redacted]d here in MAU reporting: ctx every 5 min, states she had her membranes swept yesterday. Having some spotting but no LOF. + FM

## 2019-01-01 NOTE — Anesthesia Procedure Notes (Signed)
Epidural Patient location during procedure: OB Start time: 01/01/2019 9:22 AM End time: 01/01/2019 9:26 AM  Staffing Anesthesiologist: Leilani Able, MD Performed: anesthesiologist   Preanesthetic Checklist Completed: patient identified, site marked, surgical consent, pre-op evaluation, timeout performed, IV checked, risks and benefits discussed and monitors and equipment checked  Epidural Patient position: sitting Prep: site prepped and draped and DuraPrep Patient monitoring: continuous pulse ox and blood pressure Approach: midline Location: L3-L4 Injection technique: LOR air  Needle:  Needle type: Tuohy  Needle gauge: 17 G Needle length: 9 cm and 9 Needle insertion depth: 5 cm cm Catheter type: closed end flexible Catheter size: 19 Gauge Catheter at skin depth: 10 cm Test dose: negative and Other  Assessment Sensory level: T9 Events: blood not aspirated, injection not painful, no injection resistance, negative IV test and no paresthesia  Additional Notes Reason for block:procedure for pain

## 2019-01-01 NOTE — Anesthesia Pain Management Evaluation Note (Signed)
  CRNA Pain Management Visit Note  Patient: Kristin Stokes, 23 y.o., female  "Hello I am a member of the anesthesia team at [facility]. We have an anesthesia team available at all times to provide care throughout the hospital, including epidural management and anesthesia for C-section. I don't know your plan for the delivery whether it a natural birth, water birth, IV sedation, nitrous supplementation, doula or epidural, but we want to meet your pain goals."   1.Was your pain managed to your expectations on prior hospitalizations?   Yes   2.What is your expectation for pain management during this hospitalization?     Epidural  3.How can we help you reach that goal? Epidural  Inserted at ~ 0935.  Record the patient's initial score and the patient's pain goal.   Pain: 7, now 0  Pain Goal: 0 [facility] wants you to be able to say your pain was always managed very well.  Kristin Stokes 01/01/2019

## 2019-01-01 NOTE — Anesthesia Preprocedure Evaluation (Signed)
Anesthesia Evaluation  Patient identified by MRN, date of birth, ID band Patient awake    Reviewed: Allergy & Precautions, H&P , NPO status , Patient's Chart, lab work & pertinent test results  Airway Mallampati: I  TM Distance: >3 FB Neck ROM: full    Dental no notable dental hx. (+) Teeth Intact   Pulmonary neg pulmonary ROS,    Pulmonary exam normal breath sounds clear to auscultation       Cardiovascular negative cardio ROS Normal cardiovascular exam Rhythm:Regular Rate:Normal     Neuro/Psych negative neurological ROS  negative psych ROS   GI/Hepatic negative GI ROS, Neg liver ROS,   Endo/Other  negative endocrine ROS  Renal/GU negative Renal ROS  negative genitourinary   Musculoskeletal   Abdominal Normal abdominal exam  (+)   Peds  Hematology negative hematology ROS (+)   Anesthesia Other Findings   Reproductive/Obstetrics (+) Pregnancy                             Anesthesia Physical  Anesthesia Plan  ASA: II  Anesthesia Plan: Epidural   Post-op Pain Management:    Induction:   PONV Risk Score and Plan:   Airway Management Planned:   Additional Equipment:   Intra-op Plan:   Post-operative Plan:   Informed Consent: I have reviewed the patients History and Physical, chart, labs and discussed the procedure including the risks, benefits and alternatives for the proposed anesthesia with the patient or authorized representative who has indicated his/her understanding and acceptance.       Plan Discussed with:   Anesthesia Plan Comments:         Anesthesia Quick Evaluation

## 2019-01-01 NOTE — H&P (Addendum)
LABOR AND DELIVERY ADMISSION HISTORY AND PHYSICAL NOTE  Kristin Stokes is a 23 y.o. female G2P1001 with IUP at [redacted]w[redacted]d by 2nd-trimester Ultrasound presenting for SOL with contractions 5-6 minutes.   She reports positive fetal movement. She denies leakage of fluid or vaginal bleeding. She had her membranes swept 12/31/2018 and has been contracting ever since.  The patient has a history of HSV. The HSV was diagnosed at [facility] but it is unknown which HSV-type was positive. The patient reports past outbreaks but denies any outbreaks during this pregnancy and has been taking Valtrex 500mg  twice daily.   Prenatal History/Complications: PNC at [facility] Pregnancy complications:  - HSV - no outbreaks this pregnancy, on Valtrex - Anemia - Hgb this admission 14.1, taking prenatal vitamin containing Fe  Past Medical History: Past Medical History:  Diagnosis Date  . Anemia   . Genital herpes    Past Surgical History: Past Surgical History:  Procedure Laterality Date  . ANKLE SURGERY Right    Obstetrical History: OB History    Gravida  2   Para  1   Term  1   Preterm      AB      Living  1     SAB      TAB      Ectopic      Multiple  0   Live Births  1          Social History: Social History   Socioeconomic History  . Marital status: Single    Spouse name: Not on file  . Number of children: Not on file  . Years of education: Not on file  . Highest education level: Not on file  Occupational History  . Not on file  Social Needs  . Financial resource strain: Not on file  . Food insecurity:    Worry: Not on file    Inability: Not on file  . Transportation needs:    Medical: Not on file    Non-medical: Not on file  Tobacco Use  . Smoking status: Never Smoker  . Smokeless tobacco: Never Used  Substance and Sexual Activity  . Alcohol use: No    Comment: Occasionally  . Drug use: No    Comment: Last use of marijuana over 1 month ago.   Marland Kitchen Sexual activity: Yes     Partners: Male    Birth control/protection: None  Lifestyle  . Physical activity:    Days per week: Not on file    Minutes per session: Not on file  . Stress: Not on file  Relationships  . Social connections:    Talks on phone: Not on file    Gets together: Not on file    Attends religious service: Not on file    Active member of club or organization: Not on file    Attends meetings of clubs or organizations: Not on file    Relationship status: Not on file  Other Topics Concern  . Not on file  Social History Narrative  . Not on file    Family History: Family History  Problem Relation Age of Onset  . Asthma Mother   . Anxiety disorder Father   . Hypertension Other   . Diabetes Other   . Cancer Other   . COPD Maternal Grandmother   . Asthma Maternal Grandmother   . Hypertension Maternal Grandmother     Allergies: Allergies  Allergen Reactions  . Latex Rash  - No other known medication allergies  Medications Prior to Admission  Medication Sig Dispense Refill Last Dose  . acetaminophen (TYLENOL) 500 MG tablet Take 1,000 mg by mouth every 6 (six) hours as needed for moderate pain.   Past Week at Unknown time  . Prenatal Vit-Fe Phos-FA-Omega (VITAFOL GUMMIES) 3.33-0.333-34.8 MG CHEW Chew 3 each by mouth daily. 90 tablet 11 12/31/2018 at Unknown time  . valACYclovir (VALTREX) 500 MG tablet Take 1 tablet (500 mg total) by mouth 2 (two) times daily. 60 tablet 1 Past Week at Unknown time  . Elastic Bandages & Supports (COMFORT FIT MATERNITY SUPP MED) MISC 1 Device by Does not apply route daily. (Patient not taking: Reported on 01/01/2019) 1 each 0 Not Taking at Unknown time  . valACYclovir (VALTREX) 1000 MG tablet Take 1 tablet (1,000 mg total) by mouth daily. (Patient not taking: Reported on 01/01/2019) 5 tablet 2 Not Taking at Unknown time   Review of Systems  All systems reviewed and negative except as stated in HPI  Physical Exam Blood pressure 98/60, pulse (!) 101,  temperature 98.8 F (37.1 C), temperature source Oral, resp. rate 16, height 5\' 9"  (1.753 m), weight 83.3 kg, last menstrual period 04/11/2018, SpO2 99 %, currently breastfeeding. General appearance: alert, oriented, NAD Lungs: normal respiratory effort Heart: regular rate Abdomen: soft, non-tender; gravid, FH appropriate for GA Extremities: No calf swelling or tenderness Presentation: cephalic Fetal monitoring: baseline 130, moderate variability, accels 15x15, no decels Uterine activity: contracting q5-6 minutes Dilation: 5.5 Effacement (%): 100 Station: -2, -1 Exam by:: n druebbisch rn  Prenatal labs: ABO, Rh: --/--/O POS (02/04 0818) Antibody: PENDING (02/04 0818) Rubella: 1.90 (07/30 1529) RPR: Non Reactive (11/27 1018)  HBsAg: Negative (07/30 1529)  HIV: Non Reactive (11/27 1018)  GC/Chlamydia: negative/negative (01/21/220) GBS: Positive (01/20 1648) - receiving PCN 2-hr GTT: 72/94/99 wnl (10/24/2018) Genetic screening:  Within normal limits, low risk Anatomy US: within normal limits, female  Prenatal Transfer Tool  Maternal Diabetes: No Genetic Screening: Normal Maternal Ultrasounds/Referrals: Normal Fetal Ultrasounds or other Referrals:  None Maternal Substance Abuse:  No Significant Maternal Medications:  Meds include: Other: prenatal with Iron, Valtrex Significant Maternal Lab Results: Lab values include: Group B Strep positive  Results for orders placed or performed during the hospital encounter of 01/01/19 (from the past 24 hour(s))  CBC   Collection Time: 01/01/19  8:18 AM  Result Value Ref Range   WBC 15.3 (H) 4.0 - 10.5 K/uL   RBC 4.46 3.87 - 5.11 MIL/uL   Hemoglobin 14.1 12.0 - 15.0 g/dL   HCT 21.339.7 08.636.0 - 57.846.0 %   MCV 89.0 80.0 - 100.0 fL   MCH 31.6 26.0 - 34.0 pg   MCHC 35.5 30.0 - 36.0 g/dL   RDW 46.913.1 62.911.5 - 52.815.5 %   Platelets 339 150 - 400 K/uL   nRBC 0.0 0.0 - 0.2 %  Type and screen [facility]   Collection Time: 01/01/19  8:18  AM  Result Value Ref Range   ABO/RH(D) O POS    Antibody Screen PENDING    Sample Expiration      01/04/2019 Performed at [facility], [address] [place], [place] [id number]     Patient Active Problem List   Diagnosis Date Noted  . Normal labor and delivery 01/01/2019  . GBS (group B Streptococcus carrier), +RV culture, currently pregnant 12/23/2018  . History of prior pregnancy with short cervix, currently pregnant 07/05/2018  . Supervision of other normal pregnancy, antepartum 06/26/2018  . Genital herpes  09/22/2016    Assessment: Kristin Stokes is a 23 y.o. G2P1001 at 3376w1d here for SOL.  #Labor: latent, no augmentation at this time #Pain: Epidural #FWB: Category 1 #ID:  GBS positive - receiving PCN #MOF: Breast #MOC:Undecided, desires a method of some sort #Circ:  None, female  Kristin Stokes 01/01/2019, 9:57 AM   I confirm that I have verified the information documented in the resident's note and that I have also personally reperformed the physical exam and all medical decision making activities.   Clayton BiblesSamantha Coral Soler, CNM 01/01/19  10:14 AM

## 2019-01-01 NOTE — Progress Notes (Signed)
LABOR PROGRESS NOTE  Kristin Stokes is a 23 y.o. G2P1001 at [redacted]w[redacted]d admitted for SOL.  Subjective: Patient reports positive fetal movement and otherwise denies leakage of fluid. In NAD.  Objective: BP 115/73   Pulse 92   Temp 97.8 F (36.6 C) (Oral)   Resp 16   Ht 5\' 9"  (1.753 m)   Wt 83.3 kg   LMP 04/11/2018 (Approximate)   SpO2 99%   BMI 27.13 kg/m  or  Vitals:   01/01/19 1231 01/01/19 1301 01/01/19 1331 01/01/19 1401  BP: (!) 108/52 (!) 85/57 108/69 115/73  Pulse: 95 (!) 101 97 92  Resp: 16 16 16 16   Temp:  97.8 F (36.6 C)    TempSrc:  Oral    SpO2:      Weight:      Height:       Dilation: 8.5 Effacement (%): 100 Cervical Position: Middle Station: -1 Presentation: Vertex Exam by:: J.Follmer,RNC FHT: baseline rate 135, moderate varibility, 15x15 accel, early decels  Labs: Lab Results  Component Value Date   WBC 15.3 (H) 01/01/2019   HGB 14.1 01/01/2019   HCT 39.7 01/01/2019   MCV 89.0 01/01/2019   PLT 339 01/01/2019    Patient Active Problem List   Diagnosis Date Noted  . Normal labor and delivery 01/01/2019  . GBS (group B Streptococcus carrier), +RV culture, currently pregnant 12/23/2018  . History of prior pregnancy with short cervix, currently pregnant 07/05/2018  . Supervision of other normal pregnancy, antepartum 06/26/2018  . Genital herpes 09/22/2016    Assessment / Plan: 23 y.o. G2P1001 at [redacted]w[redacted]d here for SOL.  Labor: Active, patient AROMed at 1311 with clear fluid Fetal Wellbeing:  Category 1 Pain Control:  Epidural Anticipated MOD:  Vaginal   Peggyann Shoals, DO [facility], PGY-1 01/01/2019 3:02 PM

## 2019-01-02 LAB — CBC
HCT: 36.2 % (ref 36.0–46.0)
Hemoglobin: 13 g/dL (ref 12.0–15.0)
MCH: 31.9 pg (ref 26.0–34.0)
MCHC: 35.9 g/dL (ref 30.0–36.0)
MCV: 88.9 fL (ref 80.0–100.0)
Platelets: 305 10*3/uL (ref 150–400)
RBC: 4.07 MIL/uL (ref 3.87–5.11)
RDW: 13.2 % (ref 11.5–15.5)
WBC: 18.7 10*3/uL — ABNORMAL HIGH (ref 4.0–10.5)
nRBC: 0 % (ref 0.0–0.2)

## 2019-01-02 MED ORDER — IBUPROFEN 600 MG PO TABS: 600.0000 mg | ORAL_TABLET | Freq: Four times a day (QID) | ORAL | 0 refills | Status: DC

## 2019-01-02 NOTE — Lactation Note (Signed)
This note was copied from a baby's chart. Lactation Consultation Note: Experienced BF mom latching baby as I went into room. Reports she is still nursing her 25 year old at night. Reminded mom to keep baby close to the breast throughout the feeding. BF brochure given. Reviewed our phone number, OP appointments and BFSG as resources for support after DC. No questions at present. To call prn May go home later today.   Patient Name: Kristin Stokes [id number] Date: 01/02/2019 Reason for consult: Initial assessment   Maternal Data Formula Feeding for Exclusion: No Has patient been taught Hand Expression?: Yes(mom states she knows how) Does the patient have breastfeeding experience prior to this delivery?: Yes  Feeding Feeding Type: Breast Fed  LATCH Score Latch: Grasps breast easily, tongue down, lips flanged, rhythmical sucking.  Audible Swallowing: A few with stimulation  Type of Nipple: Everted at rest and after stimulation  Comfort (Breast/Nipple): Soft / non-tender  Hold (Positioning): No assistance needed to correctly position infant at breast.  LATCH Score: 9  Interventions    Lactation Tools Discussed/Used     Consult Status Consult Status: Complete    Pamelia Hoit 01/02/2019, 11:19 AM

## 2019-01-02 NOTE — Discharge Summary (Signed)
Postpartum Discharge Summary     Patient Name: Kristin Stokes DOB: Feb 23, 1996 MRN: [id number]  Date of admission: 01/01/2019 Delivering Provider: Dollene Cleveland   Date of discharge: 01/02/2019  Admitting diagnosis: CTX Intrauterine pregnancy: [redacted]w[redacted]d     Secondary diagnosis:  Active Problems:   Normal labor and delivery  Additional problems:  -HSV -Anemia     Discharge diagnosis: Term Pregnancy Delivered                                                                                                Post partum procedures:none  Augmentation: AROM  Complications: None  Hospital course:  Onset of Labor With Vaginal Delivery     23 y.o. yo [id number] at [redacted]w[redacted]d was admitted in Active Labor on 01/01/2019. Patient had an uncomplicated labor course as follows:  Membrane Rupture Time/Date: 1:10 PM ,01/01/2019   Intrapartum Procedures: Episiotomy: None [1]                                         Lacerations:  1st degree [2];Perineal [11]  Patient had a delivery of a Viable infant. 01/01/2019  Information for the patient's newborn:  [place], [place] [[id number]]  Delivery Method: Vaginal, Spontaneous(Filed from Delivery Summary)    Pateint had an uncomplicated postpartum course.  She is ambulating, tolerating a regular diet, passing flatus, and urinating well. Patient is discharged home in stable condition on 01/02/19.   Magnesium Sulfate recieved: No BMZ received: No  Physical exam  Vitals:   01/01/19 1822 01/01/19 2140 01/02/19 0535 01/02/19 1020  BP: 108/66 107/77 111/72 (!) 102/56  Pulse: 73 68 71 85  Resp: 16 18 20 18   Temp: 98.2 F (36.8 C) 98 F (36.7 C) 97.8 F (36.6 C) 98.3 F (36.8 C)  TempSrc:  Oral Oral Oral  SpO2:    100%  Weight:      Height:       General: alert, cooperative and no distress Lochia: appropriate Uterine Fundus: firm Laceration: Healing well with no significant drainage, No significant erythema DVT Evaluation: No evidence of DVT seen  on physical exam. Negative Homan's sign. No cords or calf tenderness. No significant calf/ankle edema. Labs: Lab Results  Component Value Date   WBC 18.7 (H) 01/02/2019   HGB 13.0 01/02/2019   HCT 36.2 01/02/2019   MCV 88.9 01/02/2019   PLT 305 01/02/2019   CMP Latest Ref Rng & Units 02/14/2016  Glucose 65 - 99 mg/dL 94  BUN 6 - 20 mg/dL 7  Creatinine 2.26 - 3.33 mg/dL 5.45  Sodium 625 - 638 mmol/L 141  Potassium 3.5 - 5.1 mmol/L 4.0  Chloride 101 - 111 mmol/L 109  CO2 22 - 32 mmol/L 23  Calcium 8.9 - 10.3 mg/dL 9.3    Discharge instruction: per After Visit Summary and "Baby and Me Booklet".  After visit meds:  Allergies as of 01/02/2019      Reactions   Latex Rash      Medication List  STOP taking these medications   acetaminophen 500 MG tablet Commonly known as:  TYLENOL   COMFORT FIT MATERNITY SUPP MED Misc   valACYclovir 1000 MG tablet Commonly known as:  VALTREX   valACYclovir 500 MG tablet Commonly known as:  VALTREX     TAKE these medications   ibuprofen 600 MG tablet Commonly known as:  ADVIL,MOTRIN Take 1 tablet (600 mg total) by mouth every 6 (six) hours.   VITAFOL GUMMIES 3.33-0.333-34.8 MG Chew Chew 3 each by mouth daily.       Diet: routine diet  Activity: Advance as tolerated. Pelvic rest for 6 weeks.   Outpatient follow up:6 weeks Follow up Appt: Future Appointments  Date Time Provider Department Center  01/07/2019  2:15 PM Leftwich-Kirby, Wilmer Floor, CNM [facility] None   Follow up Visit: Follow-up Information    [facility]. Schedule an appointment as soon as possible for a visit in 6 week(s).   Contact information: [address] [place] [id number] [phone]           Please schedule this patient for Postpartum visit in: 6 weeks with the following provider: Any provider For C/S patients schedule nurse incision check in weeks 2 weeks: no Low risk pregnancy complicated by:  none Delivery mode:  SVD Anticipated Birth Control:  POPs PP Procedures needed: none  Schedule Integrated BH visit: no      Newborn Data: Live born female  Birth Weight: 7 lb 4.8 oz (3310 g) APGAR: 9, 9  Newborn Delivery   Birth date/time:  01/01/2019 16:17:00 Delivery type:  Vaginal, Spontaneous     Baby Feeding: Breast Disposition:home with mother   01/02/2019 Donette Larry, CNM

## 2019-01-02 NOTE — Anesthesia Postprocedure Evaluation (Signed)
Anesthesia Post Note  Patient: Kristin Stokes  Procedure(s) Performed: AN AD HOC LABOR EPIDURAL     Patient location during evaluation: Mother Baby Anesthesia Type: Epidural Level of consciousness: awake, awake and alert and oriented Pain management: pain level controlled Vital Signs Assessment: post-procedure vital signs reviewed and stable Respiratory status: spontaneous breathing Cardiovascular status: blood pressure returned to baseline Postop Assessment: no headache, no backache, epidural receding, patient able to bend at knees, no apparent nausea or vomiting, adequate PO intake and able to ambulate Anesthetic complications: no    Last Vitals:  Vitals:   01/01/19 2140 01/02/19 0535  BP: 107/77 111/72  Pulse: 68 71  Resp: 18 20  Temp: 36.7 C 36.6 C  SpO2:      Last Pain:  Vitals:   01/02/19 0750  TempSrc:   PainSc: 2    Pain Goal:                   Cleda Clarks

## 2019-01-02 NOTE — Lactation Note (Signed)
This note was copied from a baby's chart. Lactation Consultation Note  Patient Name: Kristin Stokes [id number] Date: 01/02/2019   P2, 13 hour female infant. LC entered room and Mom and infant asleep.   Maternal Data    Feeding Feeding Type: Breast Fed  LATCH Score                   Interventions    Lactation Tools Discussed/Used     Consult Status      Kristin Stokes 01/02/2019, 5:19 AM

## 2019-01-03 ENCOUNTER — Ambulatory Visit: Payer: Self-pay

## 2019-01-03 NOTE — Lactation Note (Signed)
This note was copied from a baby's chart. Lactation Consultation Note  Patient Name: Kristin Stokes [id number] Date: 01/03/2019 Reason for consult: Follow-up assessment;Term  P2 mother whose infant is now 37 hours old.  Mother is still breast feeding her 25 year old.  Mother had no questions/concerns related to breast feeding.  She feels very confident in her ability to breast feed and has been doing well in the hospital.  Engorgement prevention/treatment reviewed.  Mother has our OP phone number if questions arise after discharge.  Father present.  RN in room for discharge.   Maternal Data Formula Feeding for Exclusion: No Has patient been taught Hand Expression?: Yes  Feeding Feeding Type: Breast Fed  LATCH Score Latch: Grasps breast easily, tongue down, lips flanged, rhythmical sucking.  Audible Swallowing: Spontaneous and intermittent  Type of Nipple: Everted at rest and after stimulation  Comfort (Breast/Nipple): Soft / non-tender  Hold (Positioning): No assistance needed to correctly position infant at breast.  LATCH Score: 10  Interventions    Lactation Tools Discussed/Used Pump Review: (Mother declined instructions on set up)   Consult Status Consult Status: Complete    Ladean Steinmeyer R Lealer Marsland 01/03/2019, 8:48 AM

## 2019-01-07 ENCOUNTER — Encounter: Payer: Medicaid Other | Admitting: Advanced Practice Midwife

## 2019-01-11 ENCOUNTER — Other Ambulatory Visit: Payer: Self-pay

## 2019-01-11 MED ORDER — PRENATAL VITAMIN 27-0.8 MG PO TABS: 1.0000 | ORAL_TABLET | Freq: Every day | ORAL | 12 refills | Status: DC

## 2019-01-14 ENCOUNTER — Inpatient Hospital Stay (HOSPITAL_COMMUNITY): Payer: Medicaid Other

## 2019-02-12 ENCOUNTER — Ambulatory Visit (INDEPENDENT_AMBULATORY_CARE_PROVIDER_SITE_OTHER): Payer: BC Managed Care – PPO | Admitting: Advanced Practice Midwife

## 2019-02-12 ENCOUNTER — Other Ambulatory Visit: Payer: Self-pay

## 2019-02-12 DIAGNOSIS — Z1389 Encounter for screening for other disorder: Secondary | ICD-10-CM

## 2019-02-12 DIAGNOSIS — M545 Low back pain, unspecified: Secondary | ICD-10-CM

## 2019-02-12 DIAGNOSIS — L739 Follicular disorder, unspecified: Secondary | ICD-10-CM

## 2019-02-12 DIAGNOSIS — Z3009 Encounter for other general counseling and advice on contraception: Secondary | ICD-10-CM

## 2019-02-12 NOTE — Progress Notes (Signed)
Post Partum Exam  Kristin Stokes is a 23 y.o. G60P2002 female who presents for a postpartum visit. She is 6 weeks postpartum following a spontaneous vaginal delivery. I have fully reviewed the prenatal and intrapartum course. The delivery was at 39 gestational weeks.  Anesthesia: epidural. Postpartum course has been doing well. Baby's course has been doing well. Baby is feeding by breast. Bleeding no bleeding. Bowel function is normal. Bladder function is normal. Patient is not sexually active. Contraception method is abstinence.  Postpartum depression screening:neg, score 0.  The following portions of the patient's history were reviewed and updated as appropriate: allergies, current medications, past family history, past medical history, past social history, past surgical history and problem list. Last pap smear done 05/2018 and was Normal  Review of Systems Pertinent items noted in HPI and remainder of comprehensive ROS otherwise negative.    Objective:  unknown if currently breastfeeding.  BP 113/71   Pulse 99   Wt 79.8 kg   BMI 25.99 kg/m    VS reviewed, nursing note reviewed,  Constitutional: well developed, well nourished, no distress HEENT: normocephalic CV: normal rate Pulm/chest wall: normal effort Breast Exam:  Deferred Abdomen: soft Neuro: alert and oriented x 3 Skin: warm, dry Psych: affect normal Pelvic exam:Deferred     Assessment/Plan:   1. Postpartum care following vaginal delivery --Doing well.  2. Encounter for counseling regarding contraception --Discussed LARCs as most effective forms of birth control.  Discussed benefits/risks of other methods.  Pt desires condoms. Has not tolerated hormonal contraception in the past.  Discussed Paragard as a nonhormonal option. Pt to use condoms for now.    3. Midline low back pain without sciatica, unspecified chronicity --Mild, since childbirth/epidural.  Printed back strengthening exercises given today. Pt to f/u  if not improving.  4. Folliculitis --Pt with frequent ingrown hairs in labial area that sometimes lead to boils.  They self resolve and do not get very large.  Waxing hair seems to improve them but pt has not been waxing postpartum so boils returned.  --Discussed keeping area clean/dry, use of topical OTC abx creams PRN, intermittent use of PO abx PRN.  Pt to f/u with our office and/or dermatologist if persists.     Social/emotional concerns:  None, doing well, reports good support.  Contraception: condoms  Follow up in: 1 year or as needed.   Sharen Counter, CNM 9:51 AM

## 2019-02-12 NOTE — Patient Instructions (Signed)

## 2019-06-04 ENCOUNTER — Ambulatory Visit: Payer: BC Managed Care – PPO | Admitting: Obstetrics & Gynecology

## 2019-06-05 ENCOUNTER — Ambulatory Visit: Payer: BC Managed Care – PPO | Admitting: Obstetrics

## 2019-07-13 ENCOUNTER — Other Ambulatory Visit: Payer: Self-pay | Admitting: Nurse Practitioner

## 2019-07-13 DIAGNOSIS — Z3480 Encounter for supervision of other normal pregnancy, unspecified trimester: Secondary | ICD-10-CM

## 2019-09-09 ENCOUNTER — Encounter: Payer: Self-pay | Admitting: Obstetrics

## 2019-09-09 ENCOUNTER — Other Ambulatory Visit: Payer: Self-pay

## 2019-09-09 ENCOUNTER — Ambulatory Visit (INDEPENDENT_AMBULATORY_CARE_PROVIDER_SITE_OTHER): Payer: BC Managed Care – PPO | Admitting: Obstetrics

## 2019-09-09 VITALS — BP 122/79 | HR 83 | Ht 69.0 in | Wt 171.0 lb

## 2019-09-09 DIAGNOSIS — L738 Other specified follicular disorders: Secondary | ICD-10-CM

## 2019-09-09 MED ORDER — CLINDAMYCIN PHOSPHATE 1 % EX LOTN: TOPICAL_LOTION | Freq: Two times a day (BID) | CUTANEOUS | 1 refills | Status: DC

## 2019-09-09 NOTE — Progress Notes (Signed)
Patient is in the office for ingrown hair treatment in pubic area.

## 2019-09-09 NOTE — Patient Instructions (Signed)

## 2019-09-09 NOTE — Progress Notes (Signed)
Patient ID: Kristin Stokes, female   DOB: [birth year], 23 y.o.   MRN: [id number]  Chief Complaint  Patient presents with  . GYN    HPI Kristin Stokes is a 23 y.o. female.  Hair bumps in pubic area. HPI  Past Medical History:  Diagnosis Date  . Anemia   . Genital herpes     Past Surgical History:  Procedure Laterality Date  . ANKLE SURGERY Right     Family History  Problem Relation Age of Onset  . Asthma Mother   . Anxiety disorder Father   . Hypertension Other   . Diabetes Other   . Cancer Other   . COPD Maternal Grandmother   . Asthma Maternal Grandmother   . Hypertension Maternal Grandmother     Social History Social History   Tobacco Use  . Smoking status: Never Smoker  . Smokeless tobacco: Never Used  Substance Use Topics  . Alcohol use: No    Comment: Occasionally  . Drug use: No    Comment: Last use of marijuana over 1 month ago.     Allergies  Allergen Reactions  . Latex Rash    Current Outpatient Medications  Medication Sig Dispense Refill  . Prenatal Vit-Fe Fumarate-FA (PRENATAL VITAMIN) 27-0.8 MG TABS Take 1 tablet by mouth daily. 30 tablet 12  . clindamycin (CLEOCIN T) 1 % lotion Apply topically 2 (two) times daily. 60 mL 1  . ibuprofen (ADVIL,MOTRIN) 600 MG tablet Take 1 tablet (600 mg total) by mouth every 6 (six) hours. (Patient not taking: Reported on 09/09/2019) 30 tablet 0  . Prenatal Vit-Fe Phos-FA-Omega (VITAFOL GUMMIES) 3.33-0.333-34.8 MG CHEW Chew 3 each by mouth daily. (Patient not taking: Reported on 09/09/2019) 90 tablet 11   No current facility-administered medications for this visit.     Review of Systems Review of Systems Constitutional: negative for fatigue and weight loss Respiratory: negative for cough and wheezing Cardiovascular: negative for chest pain, fatigue and palpitations Gastrointestinal: negative for abdominal pain and change in bowel habits Genitourinary:negative Integument/breast: positive for hair  bumps in pubic area Musculoskeletal:negative for myalgias Neurological: negative for gait problems and tremors Behavioral/Psych: negative for abusive relationship, depression Endocrine: negative for temperature intolerance      Blood pressure 122/79, pulse 83, height 5\' 9"  (1.753 m), weight 171 lb (77.6 kg), last menstrual period 08/26/2019, currently breastfeeding.  Physical Exam Physical Exam General:   alert and no distress  Skin:   old, healed infected hair follicles   40% of 15 min visit spent on counseling and coordination of care.   Data Reviewed Labs Pap Smear  Assessment     1. Bacterial folliculitis Rx: - clindamycin (CLEOCIN T) 1 % lotion; Apply topically 2 (two) times daily.  Dispense: 60 mL; Refill: 1    Plan    Follow up in 2 months for Annual   Meds ordered this encounter  Medications  . clindamycin (CLEOCIN T) 1 % lotion    Sig: Apply topically 2 (two) times daily.    Dispense:  60 mL    Refill:  1    Shelly Bombard, MD 09/09/2019 4:21 PM

## 2019-10-02 ENCOUNTER — Other Ambulatory Visit: Payer: Self-pay

## 2019-10-02 DIAGNOSIS — Z20822 Contact with and (suspected) exposure to covid-19: Secondary | ICD-10-CM

## 2019-10-03 LAB — NOVEL CORONAVIRUS, NAA: SARS-CoV-2, NAA: NOT DETECTED

## 2019-11-11 ENCOUNTER — Ambulatory Visit (INDEPENDENT_AMBULATORY_CARE_PROVIDER_SITE_OTHER): Payer: BC Managed Care – PPO | Admitting: Obstetrics

## 2019-11-11 ENCOUNTER — Other Ambulatory Visit (HOSPITAL_COMMUNITY)
Admission: RE | Admit: 2019-11-11 | Discharge: 2019-11-11 | Disposition: A | Discharge status: Home / Self Care | Payer: BC Managed Care – PPO | Source: Ambulatory Visit | Attending: Obstetrics | Admitting: Obstetrics

## 2019-11-11 ENCOUNTER — Encounter: Payer: Self-pay | Admitting: Obstetrics

## 2019-11-11 ENCOUNTER — Other Ambulatory Visit: Payer: Self-pay

## 2019-11-11 VITALS — BP 112/73 | HR 73 | Wt 166.0 lb

## 2019-11-11 DIAGNOSIS — Z0000 Encounter for general adult medical examination without abnormal findings: Secondary | ICD-10-CM

## 2019-11-11 DIAGNOSIS — N898 Other specified noninflammatory disorders of vagina: Secondary | ICD-10-CM | POA: Diagnosis present

## 2019-11-11 DIAGNOSIS — Z01419 Encounter for gynecological examination (general) (routine) without abnormal findings: Secondary | ICD-10-CM | POA: Insufficient documentation

## 2019-11-11 DIAGNOSIS — Z113 Encounter for screening for infections with a predominantly sexual mode of transmission: Secondary | ICD-10-CM

## 2019-11-11 MED ORDER — VITAFOL GUMMIES 3.33-0.333-34.8 MG PO CHEW: 3.0000 | CHEWABLE_TABLET | Freq: Every day | ORAL | 3 refills | Status: DC

## 2019-11-11 NOTE — Progress Notes (Signed)
Subjective:        Kristin Stokes is a 23 y.o. female here for a routine exam.  Current complaints: None.    Personal health questionnaire:  Is patient Ashkenazi Jewish, have a family history of breast and/or ovarian cancer: no Is there a family history of uterine cancer diagnosed at age < 47, gastrointestinal cancer, urinary tract cancer, family member who is a Field seismologist syndrome-associated carrier: no Is the patient overweight and hypertensive, family history of diabetes, personal history of gestational diabetes, preeclampsia or PCOS: yes Is patient over 37, have PCOS,  family history of premature CHD under age 63, diabetes, smoke, have hypertension or peripheral artery disease:  no At any time, has a partner hit, kicked or otherwise hurt or frightened you?: no Over the past 2 weeks, have you felt down, depressed or hopeless?: no Over the past 2 weeks, have you felt little interest or pleasure in doing things?:no   Gynecologic History No LMP recorded. Contraception: none Last Pap: 06-26-2018. Results were: normal Last mammogram: n/a. Results were: n/a  Obstetric History OB History  Gravida Para Term Preterm AB Living  2 2 2     2   SAB TAB Ectopic Multiple Live Births        0 2    # Outcome Date GA Lbr Len/2nd Weight Sex Delivery Anes PTL Lv  2 Term 01/01/19 [redacted]w[redacted]d 16:02 / 00:15 7 lb 4.8 oz (3.31 kg) F Vag-Spont EPI  LIV  1 Term 10/07/16 [redacted]w[redacted]d 08:11 / 01:43 7 lb 7.4 oz (3.385 kg) F Vag-Spont EPI  LIV    Past Medical History:  Diagnosis Date  . Anemia   . Genital herpes     Past Surgical History:  Procedure Laterality Date  . ANKLE SURGERY Right      Current Outpatient Medications:  .  clindamycin (CLEOCIN T) 1 % lotion, Apply topically 2 (two) times daily., Disp: 60 mL, Rfl: 1 .  ibuprofen (ADVIL,MOTRIN) 600 MG tablet, Take 1 tablet (600 mg total) by mouth every 6 (six) hours. (Patient not taking: Reported on 09/09/2019), Disp: 30 tablet, Rfl: 0 .  Prenatal Vit-Fe  Fumarate-FA (PRENATAL VITAMIN) 27-0.8 MG TABS, Take 1 tablet by mouth daily., Disp: 30 tablet, Rfl: 12 .  Prenatal Vit-Fe Phos-FA-Omega (VITAFOL GUMMIES) 3.33-0.333-34.8 MG CHEW, Chew 3 each by mouth daily., Disp: 90 tablet, Rfl: 3 Allergies  Allergen Reactions  . Latex Rash    Social History   Tobacco Use  . Smoking status: Never Smoker  . Smokeless tobacco: Never Used  Substance Use Topics  . Alcohol use: No    Comment: Occasionally    Family History  Problem Relation Age of Onset  . Asthma Mother   . Anxiety disorder Father   . Hypertension Other   . Diabetes Other   . Cancer Other   . COPD Maternal Grandmother   . Asthma Maternal Grandmother   . Hypertension Maternal Grandmother       Review of Systems  Constitutional: negative for fatigue and weight loss Respiratory: negative for cough and wheezing Cardiovascular: negative for chest pain, fatigue and palpitations Gastrointestinal: negative for abdominal pain and change in bowel habits Musculoskeletal:negative for myalgias Neurological: negative for gait problems and tremors Behavioral/Psych: negative for abusive relationship, depression Endocrine: negative for temperature intolerance    Genitourinary:negative for abnormal menstrual periods, genital lesions, hot flashes, sexual problems and vaginal discharge Integument/breast: negative for breast lump, breast tenderness, nipple discharge and skin lesion(s)    Objective:  BP 112/73   Pulse 73   Wt 166 lb (75.3 kg)   BMI 24.51 kg/m  General:   alert  Skin:   no rash or abnormalities  Lungs:   clear to auscultation bilaterally  Heart:   regular rate and rhythm, S1, S2 normal, no murmur, click, rub or gallop  Breasts:   normal without suspicious masses, skin or nipple changes or axillary nodes  Abdomen:  normal findings: no organomegaly, soft, non-tender and no hernia  Pelvis:  External genitalia: normal general appearance Urinary system: urethral meatus  normal and bladder without fullness, nontender Vaginal: normal without tenderness, induration or masses Cervix: normal appearance Adnexa: normal bimanual exam Uterus: anteverted and non-tender, normal size   Lab Review Urine pregnancy test Labs reviewed yes Radiologic studies reviewed no  50% of 25 min visit spent on counseling and coordination of care.   Assessment:     1. Encounter for gynecological examination with Papanicolaou smear of cervix Rx: - Cytology - PAP( [facility])  2. Vaginal discharge Rx: - Cervicovaginal ancillary only( [place])  3. Screen for STD (sexually transmitted disease) Rx: - HIV antibody - Hepatitis B surface antigen - RPR - Hepatitis C antibody  4. Routine adult health maintenance Rx: - Prenatal Vit-Fe Phos-FA-Omega (VITAFOL GUMMIES) 3.33-0.333-34.8 MG CHEW; Chew 3 each by mouth daily.  Dispense: 90 tablet; Refill: 3    Plan:    Education reviewed: calcium supplements, depression evaluation, low fat, low cholesterol diet, safe sex/STD prevention, self breast exams and weight bearing exercise. Contraception: none. Follow up in: 1 year.   Meds ordered this encounter  Medications  . Prenatal Vit-Fe Phos-FA-Omega (VITAFOL GUMMIES) 3.33-0.333-34.8 MG CHEW    Sig: Chew 3 each by mouth daily.    Dispense:  90 tablet    Refill:  3   Orders Placed This Encounter  Procedures  . HIV antibody  . Hepatitis B surface antigen  . RPR  . Hepatitis C antibody    Brock Bad, MD 11/11/2019 4:28 PM

## 2019-11-12 LAB — CERVICOVAGINAL ANCILLARY ONLY
Bacterial Vaginitis (gardnerella): NEGATIVE
Candida Glabrata: NEGATIVE
Candida Vaginitis: POSITIVE — AB
Chlamydia: NEGATIVE
Comment: NEGATIVE
Comment: NEGATIVE
Comment: NEGATIVE
Comment: NEGATIVE
Comment: NEGATIVE
Comment: NORMAL
Neisseria Gonorrhea: NEGATIVE
Trichomonas: NEGATIVE

## 2019-11-12 LAB — HIV ANTIBODY (ROUTINE TESTING W REFLEX): HIV Screen 4th Generation wRfx: NONREACTIVE

## 2019-11-12 LAB — HEPATITIS C ANTIBODY: Hep C Virus Ab: <0.1 s/co ratio (ref 0.0–0.9)

## 2019-11-12 LAB — HEPATITIS B SURFACE ANTIGEN: Hepatitis B Surface Ag: NEGATIVE

## 2019-11-12 LAB — RPR: RPR Ser Ql: NONREACTIVE

## 2019-11-13 ENCOUNTER — Other Ambulatory Visit: Payer: Self-pay | Admitting: *Deleted

## 2019-11-13 DIAGNOSIS — B3731 Acute candidiasis of vulva and vagina: Secondary | ICD-10-CM

## 2019-11-13 DIAGNOSIS — B373 Candidiasis of vulva and vagina: Secondary | ICD-10-CM

## 2019-11-13 LAB — CYTOLOGY - PAP: Diagnosis: NEGATIVE

## 2019-11-13 MED ORDER — FLUCONAZOLE 150 MG PO TABS: 150.0000 mg | ORAL_TABLET | Freq: Once | ORAL | 0 refills | Status: AC

## 2019-11-13 NOTE — Progress Notes (Signed)
Pt sent message to office after seeing +yeast result on Mychart.  Diflucan has been sent in to pharmacy today.

## 2019-11-18 ENCOUNTER — Other Ambulatory Visit: Payer: Self-pay | Admitting: Obstetrics

## 2019-11-18 DIAGNOSIS — B3731 Acute candidiasis of vulva and vagina: Secondary | ICD-10-CM

## 2019-11-18 DIAGNOSIS — B373 Candidiasis of vulva and vagina: Secondary | ICD-10-CM

## 2019-11-18 MED ORDER — FLUCONAZOLE 150 MG PO TABS: 150.0000 mg | ORAL_TABLET | Freq: Once | ORAL | 0 refills | Status: AC

## 2020-01-02 ENCOUNTER — Ambulatory Visit: Payer: BC Managed Care – PPO | Attending: Internal Medicine

## 2020-01-02 DIAGNOSIS — Z20822 Contact with and (suspected) exposure to covid-19: Secondary | ICD-10-CM

## 2020-01-03 LAB — NOVEL CORONAVIRUS, NAA: SARS-CoV-2, NAA: NOT DETECTED

## 2020-02-17 ENCOUNTER — Encounter: Payer: Self-pay | Admitting: Obstetrics

## 2020-02-17 ENCOUNTER — Other Ambulatory Visit: Payer: Self-pay

## 2020-02-17 ENCOUNTER — Ambulatory Visit (INDEPENDENT_AMBULATORY_CARE_PROVIDER_SITE_OTHER): Payer: BC Managed Care – PPO | Admitting: Obstetrics

## 2020-02-17 VITALS — BP 104/70 | HR 80 | Wt 155.0 lb

## 2020-02-17 DIAGNOSIS — L739 Follicular disorder, unspecified: Secondary | ICD-10-CM

## 2020-02-17 NOTE — Progress Notes (Signed)
Patient ID: Kristin Stokes, female   DOB: 11-20-1996, 24 y.o.   MRN: [id number]  No chief complaint on file.   HPI Kristin Stokes is a 24 y.o. female.  Complains of a new bump in the lower left vulva area that is similar to the previous one that was diagnosed. HPI  Past Medical History:  Diagnosis Date  . Anemia   . Genital herpes     Past Surgical History:  Procedure Laterality Date  . ANKLE SURGERY Right     Family History  Problem Relation Age of Onset  . Asthma Mother   . Anxiety disorder Father   . Hypertension Other   . Diabetes Other   . Cancer Other   . COPD Maternal Grandmother   . Asthma Maternal Grandmother   . Hypertension Maternal Grandmother     Social History Social History   Tobacco Use  . Smoking status: Never Smoker  . Smokeless tobacco: Never Used  Substance Use Topics  . Alcohol use: No    Comment: Occasionally  . Drug use: No    Comment: Last use of marijuana over 1 month ago.     Allergies  Allergen Reactions  . Latex Rash    Current Outpatient Medications  Medication Sig Dispense Refill  . clindamycin (CLEOCIN T) 1 % lotion Apply topically 2 (two) times daily. 60 mL 1  . ibuprofen (ADVIL,MOTRIN) 600 MG tablet Take 1 tablet (600 mg total) by mouth every 6 (six) hours. (Patient not taking: Reported on 09/09/2019) 30 tablet 0  . Prenatal Vit-Fe Fumarate-FA (PRENATAL VITAMIN) 27-0.8 MG TABS Take 1 tablet by mouth daily. 30 tablet 12  . Prenatal Vit-Fe Phos-FA-Omega (VITAFOL GUMMIES) 3.33-0.333-34.8 MG CHEW Chew 3 each by mouth daily. 90 tablet 3   No current facility-administered medications for this visit.    Review of Systems Review of Systems Constitutional: negative for fatigue and weight loss Respiratory: negative for cough and wheezing Cardiovascular: negative for chest pain, fatigue and palpitations Gastrointestinal: negative for abdominal pain and change in bowel habits Genitourinary:positive for vulva hair bumps after  shaving Integument/breast: negative for nipple discharge Musculoskeletal:negative for myalgias Neurological: negative for gait problems and tremors Behavioral/Psych: negative for abusive relationship, depression Endocrine: negative for temperature intolerance      Blood pressure 104/70, pulse 80, weight 155 lb (70.3 kg), currently breastfeeding.  Physical Exam Physical Exam Pelvis  External genitalia: few areas of folliculitis Urinary system: urethral meatus normal and bladder without fullness, nontender Vaginal: normal without tenderness, induration or masses Cervix: normal appearance Adnexa: normal bimanual exam Uterus: anteverted and non-tender, normal size    50% of 15 min visit spent on counseling and coordination of care.   Data Reviewed Previous notes  Assessment     1. Folliculitis - local triple antibiotic ointment recommended - avoid shaving    Plan   Follow up prn   No orders of the defined types were placed in this encounter.  No orders of the defined types were placed in this encounter.    Brock Bad, MD 02/17/2020 9:57 AM

## 2020-09-10 ENCOUNTER — Other Ambulatory Visit: Payer: Self-pay

## 2020-09-10 ENCOUNTER — Ambulatory Visit: Payer: BC Managed Care – PPO | Attending: Internal Medicine

## 2020-09-10 DIAGNOSIS — G8929 Other chronic pain: Secondary | ICD-10-CM | POA: Diagnosis present

## 2020-09-10 DIAGNOSIS — M545 Low back pain, unspecified: Secondary | ICD-10-CM | POA: Insufficient documentation

## 2020-09-10 NOTE — Therapy (Signed)
[facility] [address] [place], [place], [address] Phone: [phone]   Fax:  [phone]  Physical Therapy Evaluation  Patient Details  Name: Kristin Stokes MRN: [id number] Date of Birth: March 12, 1996 Referring Provider (PT): Dr. Corky Downs   Encounter Date: 09/10/2020   PT End of Session - 09/10/20 1809    Visit Number 1    Number of Visits 4    Date for PT Re-Evaluation 10/08/20    Authorization Type MEd[employer] (eval + 3 visits)    Authorization - Number of Visits 1    Progress Note Due on Visit 4    PT Start Time 1710    PT Stop Time 1755    PT Time Calculation (min) 45 min    Activity Tolerance Patient tolerated treatment well    Behavior During Therapy [facility] for tasks assessed/performed           Past Medical History:  Diagnosis Date  . Anemia   . Genital herpes     Past Surgical History:  Procedure Laterality Date  . ANKLE SURGERY Right     There were no vitals filed for this visit.    Subjective Assessment - 09/10/20 1752    Subjective Pt reports of insidious onset of LBP on R side without sciatica in July 2021. Pt reports she has hx of sciatica on L side with pregnancy. Pt reorts currently, pain is intermittent but occaisonally pain can go up as high as 10/10 and it is 1-2/10 at best. Currently she feels bad flare up of herpain 1-2x/week in last 3-4 weeks. She was given Medrol pack when pain startedin July which hashelped. She takes mm relaxnts PRN. Pt reports she doesn't know what causes pain to increase to 10/10. Resting, position changes make it better.    Pertinent History n/a    Limitations Other (comment);Lifting   bending   How long can you sit comfortably? WFL    How long can you stand comfortably? WFL    Patient Stated Goals improve pain    Currently in Pain? Yes    Pain Score 2     Pain Location Back    Pain Orientation Right    Pain Descriptors / Indicators Aching;Tightness    Pain  Type Chronic pain    Pain Onset More than a month ago    Pain Frequency Intermittent    Aggravating Factors  bending, lifting    Pain Relieving Factors position change rest    Effect of Pain on Daily Activities difficulty taking care of 24yr old and 3 yrd children, difficulty with sleeping              [facility] PT Assessment - 09/10/20 0001      Assessment   Medical Diagnosis LBP    Referring Provider (PT) Dr. Corky Downs    Onset Date/Surgical Date 06/27/20    Prior Therapy none      Precautions   Precautions None      Restrictions   Weight Bearing Restrictions No      Balance Screen   Has the patient fallen in the past 6 months No      Prior Function   Level of Independence Independent      Cognition   Overall Cognitive Status Within Functional Limits for tasks assessed      Observation/Other Assessments   Observations --    Other Surveys  Oswestry Disability Index    Oswestry Disability Index  6%  Posture/Postural Control   Posture/Postural Control Postural limitations    Postural Limitations Rounded Shoulders;Forward head      ROM / Strength   AROM / PROM / Strength AROM;Strength      AROM   AROM Assessment Site Lumbar    Lumbar Flexion 100%    Lumbar Extension 100%   Pain at end range on R side L5 area   Lumbar - Right Side Bend 100%    Lumbar - Left Side Bend 100%    Lumbar - Right Rotation 100%    Lumbar - Left Rotation 100%      Strength   Overall Strength Within functional limits for tasks performed         Manual therapy Grade III-IV Central PA mobilization at L4-5  TherEx: Prone press ups: 10x Standing lumbar extensions: 10x, with buttocks against countertop, pt uses hands on coutertop to provide traction with lumbar extension               Objective measurements completed on examination: See above findings.                 PT Short Term Goals - 09/10/20 1802      PT SHORT TERM GOAL #1   Title Pt will report pain  of <5/10 at worst with lifting and carrying her 79yr old and 3 yr child.    Baseline 10/10 with bending and lifting children at home    Time 4    Period Weeks    Status New    Target Date 10/08/20             PT Long Term Goals - 09/10/20 1804      PT LONG TERM GOAL #1   Title Pt will report <2/10 pain at worst with all aspects of home and work life to improve overall function    Baseline 10/10 at worst intermittently    Time 8    Period Weeks    Status New    Target Date 11/05/20                  Plan - 09/10/20 1804    Clinical Impression Statement Patient is a 24 y.o. female who was seen today for physical therapy evaluation and treatment for LBP that started in July of 2021 insidiously. Patient demonstrates [facility] of ROM with provocation of pain with end range extension motion in her lowr back. Patient demonstrates 5/5 strength in bil LE without pain. Patient demonstrates decreased vertebral mobility in L4-5 vertebrae with pain provocation. Patient will benefit from skilled PT to address her mobility impairments and reduce pain to improve overall function and attain her functional gaols.    Examination-Activity Limitations Bend;Caring for Others;Lift;Sleep    Examination-Participation Restrictions Cleaning;Laundry;Occupation;Other    Clinical Decision Making Low    Rehab Potential Good    PT Frequency 1x / week    PT Duration 4 weeks    PT Treatment/Interventions Cryotherapy;ADLs/Self Care Home Management;Moist Heat;Gait training;Stair training;Functional mobility training;Therapeutic activities;Therapeutic exercise;Balance training;Manual techniques;Patient/family education;Neuromuscular re-education;Passive range of motion;Energy conservation;Spinal Manipulations;Joint Manipulations    PT Next Visit Plan Reassess press ups and standing lumbar extensions, mobilize L4-5, progress as tolerated    PT Home Exercise Plan Access Code: [email or web address]    Consulted and Agree with Plan of  Care Patient           Patient will benefit from skilled therapeutic intervention in order to improve the following deficits and impairments:  Increased muscle  spasms, Hypomobility, Increased fascial restricitons, Postural dysfunction, Pain  Visit Diagnosis: Chronic bilateral low back pain without sciatica     Problem List Patient Active Problem List   Diagnosis Date Noted  . Genital herpes 09/22/2016    Ileana Ladd, PT 09/10/2020, 6:15 PM  [facility] [address] [place], [place], [address] Phone: [phone]   Fax:  [phone]  Name: Kristin Stokes MRN: [id number] Date of Birth: [birth year]

## 2020-09-17 ENCOUNTER — Ambulatory Visit: Payer: BC Managed Care – PPO | Admitting: Physical Therapy

## 2020-09-21 ENCOUNTER — Other Ambulatory Visit: Payer: Self-pay

## 2020-09-21 ENCOUNTER — Ambulatory Visit: Payer: BC Managed Care – PPO

## 2020-09-21 DIAGNOSIS — M545 Low back pain, unspecified: Secondary | ICD-10-CM | POA: Diagnosis not present

## 2020-09-21 DIAGNOSIS — G8929 Other chronic pain: Secondary | ICD-10-CM

## 2020-09-21 NOTE — Therapy (Signed)
[facility] [address] [place], [place], [address] Phone: [phone]   Fax:  [phone]  Physical Therapy Treatment  Patient Details  Name: Kristin Stokes MRN: [id number] Date of Birth: 1996/01/21 Referring Provider (PT): Dr. Corky Downs   Encounter Date: 09/21/2020   PT End of Session - 09/21/20 1729    Visit Number 2    Number of Visits 4    Date for PT Re-Evaluation 10/08/20    Authorization Type MEd[employer] (eval + 3 visits)    Authorization - Number of Visits 2    Progress Note Due on Visit 4    PT Start Time 1705    PT Stop Time 1745    PT Time Calculation (min) 40 min    Activity Tolerance Patient tolerated treatment well    Behavior During Therapy [facility] for tasks assessed/performed           Past Medical History:  Diagnosis Date  . Anemia   . Genital herpes     Past Surgical History:  Procedure Laterality Date  . ANKLE SURGERY Right     There were no vitals filed for this visit.   Subjective Assessment - 09/21/20 1726    Subjective Pt reports back is feeling better. Currently no pain. Her intense pain is not as bad as before. She noticed that long car rides irritate her back.    Pertinent History n/a    Limitations Other (comment);Lifting   bending   How long can you sit comfortably? WFL    How long can you stand comfortably? WFL    Patient Stated Goals improve pain    Currently in Pain? No/denies    Pain Onset More than a month ago                  Manual therapy Grade III-IV Central PA mobilization at L4-5  TherEx: Prone press ups: 10x 1/2 foam roll pec stretch: 2' 1/2 foam roll snow angels: 10x Cat and camel: 10x Standing lumbar extensions: 10x, with buttocks against countertop, pt uses hands on coutertop to provide traction with lumbar extension Nustep: level 5 for 10', practicing neutral lumbar spine while she is performing the UE and LE movements Postural  education/demonstration: cues to keep lumbar spine neutral when sitting and not slouching throught the back rest                     PT Short Term Goals - 09/10/20 1802      PT SHORT TERM GOAL #1   Title Pt will report pain of <5/10 at worst with lifting and carrying her 54yr old and 3 yr child.    Baseline 10/10 with bending and lifting children at home    Time 4    Period Weeks    Status New    Target Date 10/08/20             PT Long Term Goals - 09/10/20 1804      PT LONG TERM GOAL #1   Title Pt will report <2/10 pain at worst with all aspects of home and work life to improve overall function    Baseline 10/10 at worst intermittently    Time 8    Period Weeks    Status New    Target Date 11/05/20                 Plan - 09/21/20 1727    Clinical Impression Statement Pt continues  to have most significant mobility restrictions at L5 and L4. Pt able to lock her elbows fully with press ups today after manua ltherapy and exercisese. Pt responding well with extension bias exercises.    Examination-Activity Limitations Bend;Caring for Others;Lift;Sleep    Examination-Participation Restrictions Cleaning;Laundry;Occupation;Other    Rehab Potential Good    PT Frequency 1x / week    PT Duration 4 weeks    PT Treatment/Interventions Cryotherapy;ADLs/Self Care Home Management;Moist Heat;Gait training;Stair training;Functional mobility training;Therapeutic activities;Therapeutic exercise;Balance training;Manual techniques;Patient/family education;Neuromuscular re-education;Passive range of motion;Energy conservation;Spinal Manipulations;Joint Manipulations    PT Next Visit Plan Reassess press ups and standing lumbar extensions, mobilize L4-5, progress as tolerated    PT Home Exercise Plan Access Code: [email or web address]    Consulted and Agree with Plan of Care Patient           Patient will benefit from skilled therapeutic intervention in order to improve the following  deficits and impairments:  Increased muscle spasms, Hypomobility, Increased fascial restricitons, Postural dysfunction, Pain  Visit Diagnosis: Chronic bilateral low back pain without sciatica     Problem List Patient Active Problem List   Diagnosis Date Noted  . Genital herpes 09/22/2016    Ileana Ladd, PT 09/21/2020, 5:34 PM  [facility] [address] [place], [place], [address] Phone: [phone]   Fax:  [phone]  Name: Kristin Stokes MRN: [id number] Date of Birth: 1996/10/16

## 2020-09-21 NOTE — Patient Instructions (Signed)
Access Code: [email or web address] URL: [email or web address] Date: 09/21/2020 Prepared by: Lavone Nian  Exercises Prone Press Up - 2 x daily - 7 x weekly - 10 reps Standing Lumbar Extension - 2 x daily - 7 x weekly - 1 sets - 10 reps [employer] on Foam Roll - 1 x daily - 7 x weekly - 10 reps Cat-Camel - 1 x daily - 7 x weekly - 10 reps

## 2020-10-01 ENCOUNTER — Ambulatory Visit: Payer: BC Managed Care – PPO | Admitting: Physical Therapy

## 2020-10-02 ENCOUNTER — Other Ambulatory Visit: Payer: Self-pay

## 2020-10-02 ENCOUNTER — Ambulatory Visit: Payer: BC Managed Care – PPO | Admitting: Physical Therapy

## 2020-10-02 DIAGNOSIS — M545 Low back pain, unspecified: Secondary | ICD-10-CM | POA: Insufficient documentation

## 2020-10-02 DIAGNOSIS — G8929 Other chronic pain: Secondary | ICD-10-CM

## 2020-10-02 NOTE — Therapy (Signed)
[address] [place], [place], [address] Phone: [phone]   Fax:  [phone]  Physical Therapy Treatment  Patient Details  Name: Kristin Stokes MRN: [id number] Date of Birth: February 21, 1996 Referring Provider (PT): Dr. Maia Petties   Encounter Date: 10/02/2020   PT End of Session - 10/02/20 1547    Visit Number 3    Number of Visits 4    Date for PT Re-Evaluation 10/08/20    Authorization Type MEd[employer] (eval + 3 visits)    Authorization - Number of Visits 3    Progress Note Due on Visit 4    PT Start Time 1345    PT Stop Time 1350    PT Time Calculation (min) 5 min    Activity Tolerance Patient tolerated treatment well    Behavior During Therapy [facility] for tasks assessed/performed           Past Medical History:  Diagnosis Date  . Anemia   . Genital herpes     Past Surgical History:  Procedure Laterality Date  . ANKLE SURGERY Right     There were no vitals filed for this visit.   Subjective Assessment - 10/02/20 1602    Subjective Pt reports no pain with all activity. Pt states she has been mindful on her posture and has been performing her stretches and exercise.    Pertinent History n/a    Limitations Other (comment);Lifting   bending   How long can you sit comfortably? WFL    How long can you stand comfortably? WFL    Patient Stated Goals improve pain    Currently in Pain? No/denies    Pain Onset More than a month ago              [facility] PT Assessment - 10/02/20 0001      AROM   Lumbar Flexion 100%    Lumbar Extension 100%    Lumbar - Right Side Bend 100%    Lumbar - Left Side Bend 100%    Lumbar - Right Rotation 100%    Lumbar - Left Rotation 100%      Strength   Overall Strength Within functional limits for tasks performed                                   PT Short Term Goals - 10/02/20 1553      PT SHORT TERM GOAL #1   Title Pt will report pain of  <5/10 at worst with lifting and carrying her 33yrold and 3 yr child.    Baseline 10/10 with bending and lifting children at home; 0/10 pain with all activity    Time 4    Period Weeks    Status Achieved    Target Date 10/08/20             PT Long Term Goals - 10/02/20 1549      PT LONG TERM GOAL #1   Title Pt will report <2/10 pain at worst with all aspects of home and work life to improve overall function    Baseline 10/10 at worst intermittently; 0/10 with all activity (10/02/20)    Time 8    Period Weeks    Status Achieved                 Plan - 10/02/20 1604    Clinical Impression Statement Pt has responded  very well to PT education and HEP. Pt no longer experiences any pain with activity. Upon palpation, pt demonstrates improved lumbar spine mobility with less restriction at L5 and L4. Pt has met all PT goals and has no more PT needs at this time. Pt advised to call if any issues or if she has any need for further therapy. Pt is ready for D/C.    Examination-Activity Limitations Bend;Caring for Others;Lift;Sleep    Examination-Participation Restrictions Cleaning;Laundry;Occupation;Other    Rehab Potential Good    PT Frequency 1x / week    PT Duration 4 weeks    PT Treatment/Interventions Cryotherapy;ADLs/Self Care Home Management;Moist Heat;Gait training;Stair training;Functional mobility training;Therapeutic activities;Therapeutic exercise;Balance training;Manual techniques;Patient/family education;Neuromuscular re-education;Passive range of motion;Energy conservation;Spinal Manipulations;Joint Manipulations    PT Next Visit Plan Reassess press ups and standing lumbar extensions, mobilize L4-5, progress as tolerated    PT Home Exercise Plan Access Code: [email or web address]    Consulted and Agree with Plan of Care Patient           Patient will benefit from skilled therapeutic intervention in order to improve the following deficits and impairments:  Increased muscle spasms,  Hypomobility, Increased fascial restricitons, Postural dysfunction, Pain  Visit Diagnosis: Chronic bilateral low back pain without sciatica    PHYSICAL THERAPY DISCHARGE SUMMARY  Visits from Start of Care: 3  Current functional level related to goals / functional outcomes: Has returned to PLOF   Remaining deficits: None   Education / Equipment: Posture and HEP  Plan: Patient agrees to discharge.  Patient goals were met. Patient is being discharged due to meeting the stated rehab goals.  ?????         Problem List Patient Active Problem List   Diagnosis Date Noted  . Genital herpes 09/22/2016    [facility] April Ma L Karion Cudd PT, DPT 10/02/2020, 4:06 PM  [place] [address] [place], [place], [address] Phone: [phone]   Fax:  [phone]  Name: Mariacristina Aday MRN: [id number] Date of Birth: 1996/02/10

## 2020-10-06 ENCOUNTER — Other Ambulatory Visit: Payer: Self-pay

## 2020-10-06 ENCOUNTER — Ambulatory Visit (INDEPENDENT_AMBULATORY_CARE_PROVIDER_SITE_OTHER): Payer: BC Managed Care – PPO

## 2020-10-06 VITALS — BP 112/74 | HR 91 | Ht 69.0 in | Wt 146.0 lb

## 2020-10-06 DIAGNOSIS — Z3480 Encounter for supervision of other normal pregnancy, unspecified trimester: Secondary | ICD-10-CM | POA: Insufficient documentation

## 2020-10-06 DIAGNOSIS — Z3201 Encounter for pregnancy test, result positive: Secondary | ICD-10-CM

## 2020-10-06 LAB — POCT URINE PREGNANCY: Preg Test, Ur: POSITIVE — AB

## 2020-10-06 MED ORDER — BLOOD PRESSURE KIT DEVI: 1.0000 | 0 refills | Status: AC

## 2020-10-06 MED ORDER — BLOOD PRESSURE KIT DEVI: 1.0000 | 0 refills | Status: DC

## 2020-10-06 NOTE — Progress Notes (Signed)
Kristin Stokes presents today for UPT. She has no unusual complaints.  LMP:08/14/2020 EDD:05/21/2021  [redacted]w[redacted]d    OBJECTIVE: Appears well, in no apparent distress.  OB History    Gravida  3   Para  2   Term  2   Preterm      AB      Living  2     SAB      TAB      Ectopic      Multiple  0   Live Births  2          Home UPT Result:POSITIVE X3 In-Office UPT result: POSITIVE  I have reviewed the patient's medical, obstetrical, social, and family histories, and medications.   ASSESSMENT: Positive pregnancy test LMP:08/14/2020 EDD:05/21/2021  [redacted]w[redacted]d   PLAN Prenatal care to be completed at: [facility] Cuff ordered

## 2020-10-07 NOTE — Progress Notes (Signed)
Patient was assessed and managed by nursing staff during this encounter. I have reviewed the chart and agree with the documentation and plan. I have also made any necessary editorial changes.  Anjanae Woehrle, MD 10/07/2020 10:37 AM 

## 2020-10-08 ENCOUNTER — Ambulatory Visit: Payer: BC Managed Care – PPO | Admitting: Physical Therapy

## 2020-10-27 ENCOUNTER — Other Ambulatory Visit: Payer: Self-pay

## 2020-10-27 ENCOUNTER — Ambulatory Visit (INDEPENDENT_AMBULATORY_CARE_PROVIDER_SITE_OTHER): Payer: BC Managed Care – PPO

## 2020-10-27 VITALS — BP 108/74 | HR 103 | Ht 69.0 in | Wt 155.7 lb

## 2020-10-27 DIAGNOSIS — Z789 Other specified health status: Secondary | ICD-10-CM | POA: Diagnosis not present

## 2020-10-27 DIAGNOSIS — Z3A09 9 weeks gestation of pregnancy: Secondary | ICD-10-CM | POA: Diagnosis not present

## 2020-10-27 DIAGNOSIS — O3680X0 Pregnancy with inconclusive fetal viability, not applicable or unspecified: Secondary | ICD-10-CM | POA: Diagnosis not present

## 2020-10-27 DIAGNOSIS — A6000 Herpesviral infection of urogenital system, unspecified: Secondary | ICD-10-CM

## 2020-10-27 DIAGNOSIS — Z3480 Encounter for supervision of other normal pregnancy, unspecified trimester: Secondary | ICD-10-CM

## 2020-10-27 MED ORDER — VALACYCLOVIR HCL 1 G PO TABS: 1000.0000 mg | ORAL_TABLET | Freq: Two times a day (BID) | ORAL | 0 refills | Status: DC

## 2020-10-27 MED ORDER — VALACYCLOVIR HCL 500 MG PO TABS: 500.0000 mg | ORAL_TABLET | Freq: Every day | ORAL | 6 refills | Status: DC

## 2020-10-27 NOTE — Progress Notes (Signed)
Patient was assessed and managed by nursing staff during this encounter. I have reviewed the chart and agree with the documentation and plan. I have also made any necessary editorial changes.  Jaynie Collins, MD 10/27/2020 4:39 PM

## 2020-10-27 NOTE — Progress Notes (Signed)
PRENATAL INTAKE SUMMARY  Kristin Stokes presents today New OB Nurse Interview.  OB History    Gravida  3   Para  2   Term  2   Preterm      AB      Living  2     SAB      TAB      Ectopic      Multiple  0   Live Births  2          I have reviewed the patient's medical, obstetrical, social, and family histories, medications, and available lab results.  SUBJECTIVE She has no unusual complaints. Patient states that she is currently experiencing an outbreak and needs an updated rx. Rx sent for valtrex 1000 mg BID for current outbreak X10 days and valtrex 500 mg daily ok per provider Kristin Aline, MD.  OBJECTIVE Initial Physical Exam (New OB)  GENERAL APPEARANCE: alert, well appearing   ASSESSMENT Normal pregnancy  PLAN Prenatal care to be completed at [facility] All labs to be completed at new ob provider visit Baby Scripts Ordered U/S performed today to reveals single live IUP at [redacted]w[redacted]d. FHR 157. EDD updated to represent measurement from todays scan. PHQ 2 score: 0 GAD 7 score: 1 Valtrex RX sent

## 2020-11-05 ENCOUNTER — Ambulatory Visit (INDEPENDENT_AMBULATORY_CARE_PROVIDER_SITE_OTHER): Payer: BC Managed Care – PPO

## 2020-11-05 ENCOUNTER — Encounter: Payer: Self-pay | Admitting: Obstetrics & Gynecology

## 2020-11-05 ENCOUNTER — Other Ambulatory Visit (HOSPITAL_COMMUNITY)
Admission: RE | Admit: 2020-11-05 | Discharge: 2020-11-05 | Disposition: A | Discharge status: Home / Self Care | Payer: BC Managed Care – PPO | Source: Ambulatory Visit | Attending: Obstetrics & Gynecology | Admitting: Obstetrics & Gynecology

## 2020-11-05 ENCOUNTER — Other Ambulatory Visit: Payer: Self-pay

## 2020-11-05 ENCOUNTER — Ambulatory Visit (INDEPENDENT_AMBULATORY_CARE_PROVIDER_SITE_OTHER): Payer: BC Managed Care – PPO | Admitting: Obstetrics & Gynecology

## 2020-11-05 VITALS — BP 132/81 | HR 101 | Wt 161.0 lb

## 2020-11-05 DIAGNOSIS — A6004 Herpesviral vulvovaginitis: Secondary | ICD-10-CM

## 2020-11-05 DIAGNOSIS — O368390 Maternal care for abnormalities of the fetal heart rate or rhythm, unspecified trimester, not applicable or unspecified: Secondary | ICD-10-CM

## 2020-11-05 DIAGNOSIS — Z3481 Encounter for supervision of other normal pregnancy, first trimester: Secondary | ICD-10-CM | POA: Insufficient documentation

## 2020-11-05 DIAGNOSIS — Z3A10 10 weeks gestation of pregnancy: Secondary | ICD-10-CM | POA: Diagnosis not present

## 2020-11-05 DIAGNOSIS — Z3480 Encounter for supervision of other normal pregnancy, unspecified trimester: Secondary | ICD-10-CM

## 2020-11-05 NOTE — Patient Instructions (Signed)
First Trimester of Pregnancy The first trimester of pregnancy is from week 1 until the end of week 13 (months 1 through 3). A week after a sperm fertilizes an egg, the egg will implant on the wall of the uterus. This embryo will begin to develop into a baby. Genes from you and your partner will form the baby. The female genes will determine whether the baby will be a boy or a girl. At 6-8 weeks, the eyes and face will be formed, and the heartbeat can be seen on ultrasound. At the end of 12 weeks, all the baby's organs will be formed. Now that you are pregnant, you will want to do everything you can to have a healthy baby. Two of the most important things are to get good prenatal care and to follow your health care provider's instructions. Prenatal care is all the medical care you receive before the baby's birth. This care will help prevent, find, and treat any problems during the pregnancy and childbirth. Body changes during your first trimester Your body goes through many changes during pregnancy. The changes vary from woman to woman.  You may gain or lose a couple of pounds at first.  You may feel sick to your stomach (nauseous) and you may throw up (vomit). If the vomiting is uncontrollable, call your health care provider.  You may tire easily.  You may develop headaches that can be relieved by medicines. All medicines should be approved by your health care provider.  You may urinate more often. Painful urination may mean you have a bladder infection.  You may develop heartburn as a result of your pregnancy.  You may develop constipation because certain hormones are causing the muscles that push stool through your intestines to slow down.  You may develop hemorrhoids or swollen veins (varicose veins).  Your breasts may begin to grow larger and become tender. Your nipples may stick out more, and the tissue that surrounds them (areola) may become darker.  Your gums may bleed and may be  sensitive to brushing and flossing.  Dark spots or blotches (chloasma, mask of pregnancy) may develop on your face. This will likely fade after the baby is born.  Your menstrual periods will stop.  You may have a loss of appetite.  You may develop cravings for certain kinds of food.  You may have changes in your emotions from day to day, such as being excited to be pregnant or being concerned that something may go wrong with the pregnancy and baby.  You may have more vivid and strange dreams.  You may have changes in your hair. These can include thickening of your hair, rapid growth, and changes in texture. Some women also have hair loss during or after pregnancy, or hair that feels dry or thin. Your hair will most likely return to normal after your baby is born. What to expect at prenatal visits During a routine prenatal visit:  You will be weighed to make sure you and the baby are growing normally.  Your blood pressure will be taken.  Your abdomen will be measured to track your baby's growth.  The fetal heartbeat will be listened to between weeks 10 and 14 of your pregnancy.  Test results from any previous visits will be discussed. Your health care provider may ask you:  How you are feeling.  If you are feeling the baby move.  If you have had any abnormal symptoms, such as leaking fluid, bleeding, severe headaches, or abdominal   cramping.  If you are using any tobacco products, including cigarettes, chewing tobacco, and electronic cigarettes.  If you have any questions. Other tests that may be performed during your first trimester include:  Blood tests to find your blood type and to check for the presence of any previous infections. The tests will also be used to check for low iron levels (anemia) and protein on red blood cells (Rh antibodies). Depending on your risk factors, or if you previously had diabetes during pregnancy, you may have tests to check for high blood sugar  that affects pregnant women (gestational diabetes).  Urine tests to check for infections, diabetes, or protein in the urine.  An ultrasound to confirm the proper growth and development of the baby.  Fetal screens for spinal cord problems (spina bifida) and Down syndrome.  HIV (human immunodeficiency virus) testing. Routine prenatal testing includes screening for HIV, unless you choose not to have this test.  You may need other tests to make sure you and the baby are doing well. Follow these instructions at home: Medicines  Follow your health care provider's instructions regarding medicine use. Specific medicines may be either safe or unsafe to take during pregnancy.  Take a prenatal vitamin that contains at least 600 micrograms (mcg) of folic acid.  If you develop constipation, try taking a stool softener if your health care provider approves. Eating and drinking   Eat a balanced diet that includes fresh fruits and vegetables, whole grains, good sources of protein such as meat, eggs, or tofu, and low-fat dairy. Your health care provider will help you determine the amount of weight gain that is right for you.  Avoid raw meat and uncooked cheese. These carry germs that can cause birth defects in the baby.  Eating four or five small meals rather than three large meals a day may help relieve nausea and vomiting. If you start to feel nauseous, eating a few soda crackers can be helpful. Drinking liquids between meals, instead of during meals, also seems to help ease nausea and vomiting.  Limit foods that are high in fat and processed sugars, such as fried and sweet foods.  To prevent constipation: ? Eat foods that are high in fiber, such as fresh fruits and vegetables, whole grains, and beans. ? Drink enough fluid to keep your urine clear or pale yellow. Activity  Exercise only as directed by your health care provider. Most women can continue their usual exercise routine during  pregnancy. Try to exercise for 30 minutes at least 5 days a week. Exercising will help you: ? Control your weight. ? Stay in shape. ? Be prepared for labor and delivery.  Experiencing pain or cramping in the lower abdomen or lower back is a good sign that you should stop exercising. Check with your health care provider before continuing with normal exercises.  Try to avoid standing for long periods of time. Move your legs often if you must stand in one place for a long time.  Avoid heavy lifting.  Wear low-heeled shoes and practice good posture.  You may continue to have sex unless your health care provider tells you not to. Relieving pain and discomfort  Wear a good support bra to relieve breast tenderness.  Take warm sitz baths to soothe any pain or discomfort caused by hemorrhoids. Use hemorrhoid cream if your health care provider approves.  Rest with your legs elevated if you have leg cramps or low back pain.  If you develop varicose veins in   your legs, wear support hose. Elevate your feet for 15 minutes, 3-4 times a day. Limit salt in your diet. Prenatal care  Schedule your prenatal visits by the twelfth week of pregnancy. They are usually scheduled monthly at first, then more often in the last 2 months before delivery.  Write down your questions. Take them to your prenatal visits.  Keep all your prenatal visits as told by your health care provider. This is important. Safety  Wear your seat belt at all times when driving.  Make a list of emergency phone numbers, including numbers for family, friends, the hospital, and police and fire departments. General instructions  Ask your health care provider for a referral to a local prenatal education class. Begin classes no later than the beginning of month 6 of your pregnancy.  Ask for help if you have counseling or nutritional needs during pregnancy. Your health care provider can offer advice or refer you to specialists for help  with various needs.  Do not use hot tubs, steam rooms, or saunas.  Do not douche or use tampons or scented sanitary pads.  Do not cross your legs for long periods of time.  Avoid cat litter boxes and soil used by cats. These carry germs that can cause birth defects in the baby and possibly loss of the fetus by miscarriage or stillbirth.  Avoid all smoking, herbs, alcohol, and medicines not prescribed by your health care provider. Chemicals in these products affect the formation and growth of the baby.  Do not use any products that contain nicotine or tobacco, such as cigarettes and e-cigarettes. If you need help quitting, ask your health care provider. You may receive counseling support and other resources to help you quit.  Schedule a dentist appointment. At home, brush your teeth with a soft toothbrush and be gentle when you floss. Contact a health care provider if:  You have dizziness.  You have mild pelvic cramps, pelvic pressure, or nagging pain in the abdominal area.  You have persistent nausea, vomiting, or diarrhea.  You have a bad smelling vaginal discharge.  You have pain when you urinate.  You notice increased swelling in your face, hands, legs, or ankles.  You are exposed to fifth disease or chickenpox.  You are exposed to German measles (rubella) and have never had it. Get help right away if:  You have a fever.  You are leaking fluid from your vagina.  You have spotting or bleeding from your vagina.  You have severe abdominal cramping or pain.  You have rapid weight gain or loss.  You vomit blood or material that looks like coffee grounds.  You develop a severe headache.  You have shortness of breath.  You have any kind of trauma, such as from a fall or a car accident. Summary  The first trimester of pregnancy is from week 1 until the end of week 13 (months 1 through 3).  Your body goes through many changes during pregnancy. The changes vary from  woman to woman.  You will have routine prenatal visits. During those visits, your health care provider will examine you, discuss any test results you may have, and talk with you about how you are feeling. This information is not intended to replace advice given to you by your health care provider. Make sure you discuss any questions you have with your health care provider. Document Revised: 10/27/2017 Document Reviewed: 10/26/2016 Elsevier Patient Education  2020 Elsevier Inc.  

## 2020-11-05 NOTE — Progress Notes (Signed)
History:   Kristin Stokes is a 24 y.o. G3P2002 at 13w4dby early ultrasound not consistent with LMP being seen today for her first obstetrical visit.  Her obstetrical history is significant for two term vaginal deliveries after uncomplicated pregnancies, history of genital herpes with few outbreaks for which she was on suppression during her pregnancies. Patient does intend to breast feed. Pregnancy history fully reviewed.  Patient reports no complaints.      HISTORY: OB History  Gravida Para Term Preterm AB Living  _0 0 0 2  SAB IAB Ectopic Multiple Live Births  0 0 0 0 2    # Outcome Date GA Lbr Len/2nd Weight Sex Delivery Anes PTL Lv  3 Current           2 Term 01/01/19 366w1d6:02 / 00:15 7 lb 4.8 oz (3.31 kg) F Vag-Spont EPI  LIV     Name: Kristin Stokes   Apgar1: 9  Apgar5: 9  1 Term 10/07/16 3934w6d:11 / 01:43 7 lb 7.4 oz (3.385 kg) F Vag-Spont EPI  LIV     Name: Kristin Stokes  Apgar1: 8  Apgar5: 9    Last pap smear was done 11/11/2019 and was normal  Past Medical History:  Diagnosis Date  . Anemia   . Genital herpes    Past Surgical History:  Procedure Laterality Date  . ANKLE SURGERY Right    Family History  Problem Relation Age of Onset  . Asthma Mother   . Anxiety disorder Father   . Hypertension Other   . Diabetes Other   . Cancer Other   . COPD Maternal Grandmother   . Asthma Maternal Grandmother   . Hypertension Maternal Grandmother    Social History   Tobacco Use  . Smoking status: Never Smoker  . Smokeless tobacco: Never Used  Vaping Use  . Vaping Use: Never used  Substance Use Topics  . Alcohol use: Not Currently    Comment: Occasionally  . Drug use: Not Currently    Comment: not since confirmed pregnancy   Allergies  Allergen Reactions  . Latex Rash   Current Outpatient Medications on File Prior to Visit  Medication Sig Dispense Refill  . Blood Pressure Monitoring (BLOOD PRESSURE KIT) DEVI 1 kit by Does  not apply route once a week. Check Blood Pressure regularly and record readings into the Babyscripts App.  Large Cuff.  DX O90.0 1 each 0  . Prenatal Vit-Fe Fumarate-FA (PRENATAL VITAMIN) 27-0.8 MG TABS Take 1 tablet by mouth daily. 30 tablet 12  . valACYclovir (VALTREX) 1000 MG tablet Take 1,000 mg by mouth 2 (two) times daily.    . valACYclovir (VALTREX) 1000 MG tablet Take 1 tablet (1,000 mg total) by mouth 2 (two) times daily. 20 tablet 0  . valACYclovir (VALTREX) 500 MG tablet Take 1 tablet (500 mg total) by mouth daily. 30 tablet 6   No current facility-administered medications on file prior to visit.    Review of Systems Pertinent items noted in HPI and remainder of comprehensive ROS otherwise negative.  Physical Exam:   Vitals:   11/05/20 1538  BP: 132/81  Pulse: (!) 101  Weight: 161 lb (73 kg)   Fetal Heart Rate (bpm): 159 Office Ultrasound for FHR check: Viable intrauterine pregnancy with positive cardiac activity noted (refer to AS report). Patient informed that the ultrasound is considered a limited obstetric ultrasound and is not intended to be a complete ultrasound exam.  Patient also informed that the ultrasound is not being completed with the intent of assessing for fetal or placental anomalies or any pelvic abnormalities.  Explained that the purpose of today's ultrasound is to assess for fetal heart rate.  Patient acknowledges the purpose of the exam and the limitations of the study. General: well-developed, well-nourished female in no acute distress  Breasts:  normal appearance, no masses or tenderness bilaterally  Skin: normal coloration and turgor, no rashes  Neurologic: oriented, normal, negative, normal mood  Extremities: normal strength, tone, and muscle mass, ROM of all joints is normal  HEENT PERRLA, extraocular movement intact and sclera clear, anicteric  Neck supple and no masses  Cardiovascular: regular rate and rhythm  Respiratory:  no respiratory distress,  normal breath sounds  Abdomen: soft, non-tender; bowel sounds normal; no masses,  no organomegaly  Pelvic: normal external genitalia, no lesions, normal distal vaginal mucosa, normal vaginal discharge, normal uterine size and cervix on bimanual exam    Assessment:    Pregnancy: [id number] Patient Active Problem List   Diagnosis Date Noted  . Supervision of other normal pregnancy, antepartum 10/06/2020  . Genital herpes 09/22/2016     Plan:    1. Unable to hear fetal heart tones as reason for ultrasound scan - US OB Limited showed viable intrauterine pregnancy at [redacted]w[redacted]d 2. Herpes simplex vulvovaginitis history Few outbreaks.  Will be on suppression later in pregnancy  3. [redacted] weeks gestation of pregnancy 4. Supervision of other normal pregnancy, antepartum - CBC/D/Plt+RPR+Rh+ABO+Rub Ab... - Genetic Screening - Culture, OB Urine - SMN1 Copy Number Analysis - Cervicovaginal ancillary only( [facility]) - [place] OB COMP + 14 WK Initial labs drawn. Continue prenatal vitamins. Problem list reviewed and updated. Genetic Screening discussed, NIPS: ordered. Ultrasound discussed; fetal anatomic survey: ordered. Anticipatory guidance about prenatal visits given including labs, ultrasounds, and testing. Discussed usage of Babyscripts and virtual visits as additional source of managing and completing prenatal visits in midst of coronavirus and pandemic.   Encouraged to complete MyChart Registration for her ability to review results, send requests, and have questions addressed.  The nature [facility] with multiple MDs and Advanced Practice Providers was explained to patient; also emphasized that residents, students are part of our team. Routine obstetric precautions reviewed. Encouraged to seek out care at office or emergency room ([facility]) for urgent and/or emergent concerns. Return in about 4 weeks (around 12/03/2020) for OFFICE OB  VISIT (MD or APP).     UVerita Schneiders MD, [facility] [place]

## 2020-11-06 LAB — CERVICOVAGINAL ANCILLARY ONLY
Bacterial Vaginitis (gardnerella): NEGATIVE
Candida Glabrata: NEGATIVE
Candida Vaginitis: NEGATIVE
Chlamydia: NEGATIVE
Comment: NEGATIVE
Comment: NEGATIVE
Comment: NEGATIVE
Comment: NEGATIVE
Comment: NEGATIVE
Comment: NORMAL
Neisseria Gonorrhea: NEGATIVE
Trichomonas: NEGATIVE

## 2020-11-10 LAB — URINE CULTURE, OB REFLEX

## 2020-11-10 LAB — CULTURE, OB URINE

## 2020-11-11 ENCOUNTER — Encounter: Payer: Self-pay | Admitting: Obstetrics & Gynecology

## 2020-11-11 DIAGNOSIS — O99820 Streptococcus B carrier state complicating pregnancy: Secondary | ICD-10-CM | POA: Insufficient documentation

## 2020-11-12 ENCOUNTER — Encounter: Payer: Self-pay | Admitting: Obstetrics & Gynecology

## 2020-11-13 LAB — CBC/D/PLT+RPR+RH+ABO+RUB AB...
Antibody Screen: NEGATIVE
Basophils Absolute: 0 10*3/uL (ref 0.0–0.2)
Basos: 0 %
EOS (ABSOLUTE): 0.1 10*3/uL (ref 0.0–0.4)
Eos: 2 %
HCV Ab: <0.1 s/co ratio (ref 0.0–0.9)
HIV Screen 4th Generation wRfx: NONREACTIVE
Hematocrit: 36.3 % (ref 34.0–46.6)
Hemoglobin: 12.3 g/dL (ref 11.1–15.9)
Hepatitis B Surface Ag: NEGATIVE
Immature Grans (Abs): 0 10*3/uL (ref 0.0–0.1)
Immature Granulocytes: 0 %
Lymphocytes Absolute: 2.2 10*3/uL (ref 0.7–3.1)
Lymphs: 30 %
MCH: 30.5 pg (ref 26.6–33.0)
MCHC: 33.9 g/dL (ref 31.5–35.7)
MCV: 90 fL (ref 79–97)
Monocytes Absolute: 0.7 10*3/uL (ref 0.1–0.9)
Monocytes: 9 %
Neutrophils Absolute: 4.3 10*3/uL (ref 1.4–7.0)
Neutrophils: 59 %
Platelets: 298 10*3/uL (ref 150–450)
RBC: 4.03 x10E6/uL (ref 3.77–5.28)
RDW: 12.6 % (ref 11.7–15.4)
RPR Ser Ql: NONREACTIVE
Rh Factor: POSITIVE
Rubella Antibodies, IGG: 1.58 index (ref >0.99)
WBC: 7.4 10*3/uL (ref 3.4–10.8)

## 2020-11-13 LAB — SMN1 COPY NUMBER ANALYSIS (SMA CARRIER SCREENING)

## 2020-11-13 LAB — HCV INTERPRETATION

## 2020-11-17 DIAGNOSIS — U071 COVID-19: Secondary | ICD-10-CM

## 2020-11-17 HISTORY — DX: COVID-19: U07.1

## 2020-11-19 ENCOUNTER — Encounter: Payer: Self-pay | Admitting: Obstetrics & Gynecology

## 2020-11-19 DIAGNOSIS — O98511 Other viral diseases complicating pregnancy, first trimester: Secondary | ICD-10-CM | POA: Insufficient documentation

## 2020-11-23 ENCOUNTER — Telehealth: Payer: Self-pay

## 2020-11-23 NOTE — Telephone Encounter (Signed)
TC- to patient she reports feeling a little better but has some headache and feeling sinus pressure. She is taking OTC medication to help with her symptoms.

## 2020-11-28 NOTE — L&D Delivery Note (Signed)
OB/GYN Faculty Practice Delivery Note  Kristin Stokes is a 25 y.o. G3P3003 s/p vaginal delivery at [redacted]w[redacted]d. She was admitted for spontaneous onset of labor.   ROM: 0h 56m with thick meconium stained fluid GBS Status: positive (inadequate antibiotics prior to delivery) Maximum Maternal Temperature: 97.78F  Labor Progress: Pt progressed to 8cm of cervical dilation without augmentation. Given recurrent, deep variable decels, AROM for thick meconium-stained fluid was performed at 0649. IUPC was subsequently placed and amnioinfusion was started. Pt then rapidly progressed to complete cervical dilation at 0705. Delivery as noted below was complicated by brief shoulder dystocia.  Delivery Date/Time: 05/30/21 at [id number] Delivery: Called to room and patient was complete and pushing. Head delivered LOA with compound presentation involving left fetal hand. No nuchal cord present. Approximately 30-second shoulder dystocia ultimately resolved with McRoberts and delivery of posterior arm. Fetal body then delivered in usual fashion. Infant with spontaneous cry, placed on mother's abdomen, dried and stimulated. Cord clamped x 2 after 30-second delay, and cut by author. Cord blood and arterial cord gas drawn. Placenta delivered spontaneously with gentle cord traction. Fundus firm with massage and Pitocin. Labia, perineum, vagina, and cervix were inspected, without evidence of lacerations.   Placenta: 3-vessel cord, intact, sent to L&D Complications: none Lacerations: none EBL: 100 ml Analgesia: IV fentanyl  Infant: viable female  APGARs 8 & 9  Arterial cord gas pH 7.26  weight 3640g  Lynnda Shields, MD OB/GYN Fellow, Faculty Practice

## 2020-12-03 ENCOUNTER — Ambulatory Visit (INDEPENDENT_AMBULATORY_CARE_PROVIDER_SITE_OTHER): Payer: Medicaid Other | Admitting: Obstetrics and Gynecology

## 2020-12-03 ENCOUNTER — Other Ambulatory Visit: Payer: Self-pay

## 2020-12-03 ENCOUNTER — Encounter: Payer: Self-pay | Admitting: Obstetrics and Gynecology

## 2020-12-03 VITALS — BP 113/74 | HR 96 | Wt 166.0 lb

## 2020-12-03 DIAGNOSIS — O98511 Other viral diseases complicating pregnancy, first trimester: Secondary | ICD-10-CM

## 2020-12-03 DIAGNOSIS — A6004 Herpesviral vulvovaginitis: Secondary | ICD-10-CM

## 2020-12-03 DIAGNOSIS — O99820 Streptococcus B carrier state complicating pregnancy: Secondary | ICD-10-CM

## 2020-12-03 DIAGNOSIS — U071 COVID-19: Secondary | ICD-10-CM

## 2020-12-03 DIAGNOSIS — Z3480 Encounter for supervision of other normal pregnancy, unspecified trimester: Secondary | ICD-10-CM

## 2020-12-03 NOTE — Progress Notes (Signed)
Pt reports that she is not feeling movement yet, denies pain.

## 2020-12-03 NOTE — Progress Notes (Signed)
   PRENATAL VISIT NOTE  Subjective:  Kristin Stokes is a 25 y.o. G3P2002 at [redacted]w[redacted]d being seen today for ongoing prenatal care.  She is currently monitored for the following issues for this low-risk pregnancy and has Genital herpes; Supervision of other normal pregnancy, antepartum; Group B streptococcal carriage complicating pregnancy; and COVID-19 affecting pregnancy in first trimester on their problem list.  Patient reports no complaints.  Contractions: Not present. Vag. Bleeding: None.   . Denies leaking of fluid.   The following portions of the patient's history were reviewed and updated as appropriate: allergies, current medications, past family history, past medical history, past social history, past surgical history and problem list.   Objective:   Vitals:   12/03/20 1540  BP: 113/74  Pulse: 96  Weight: 166 lb (75.3 kg)    Fetal Status: Fetal Heart Rate (bpm): 151         General:  Alert, oriented and cooperative. Patient is in no acute distress.  Skin: Skin is warm and dry. No rash noted.   Cardiovascular: Normal heart rate noted  Respiratory: Normal respiratory effort, no problems with respiration noted  Abdomen: Soft, gravid, appropriate for gestational age.  Pain/Pressure: Absent     Pelvic: Cervical exam deferred        Extremities: Normal range of motion.  Edema: None  Mental Status: Normal mood and affect. Normal behavior. Normal judgment and thought content.   Assessment and Plan:  Pregnancy: G3P2002 at [redacted]w[redacted]d 1. Supervision of other normal pregnancy, antepartum Patient is doing well without complaints Anatomy ultrasound scheduled 01/07/21 AFP next visit  2. 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 Herpes simplex vulvovaginitis Currently on valtrex  4. COVID-19 affecting pregnancy in first trimester Currently asymptomatic  Preterm labor symptoms and general obstetric precautions including but not limited to  vaginal bleeding, contractions, leaking of fluid and fetal movement were reviewed in detail with the patient. Please refer to After Visit Summary for other counseling recommendations.   Return in about 4 weeks (around 12/31/2020) for in person, [name], Low risk.  Future Appointments  Date Time Provider Department Center  01/07/2021  1:45 PM [facility]    Catalina Antigua, MD

## 2021-01-04 ENCOUNTER — Encounter: Payer: Self-pay | Admitting: Obstetrics and Gynecology

## 2021-01-04 ENCOUNTER — Other Ambulatory Visit: Payer: Self-pay

## 2021-01-04 ENCOUNTER — Ambulatory Visit (INDEPENDENT_AMBULATORY_CARE_PROVIDER_SITE_OTHER): Payer: Medicaid Other | Admitting: Obstetrics and Gynecology

## 2021-01-04 VITALS — BP 106/70 | HR 102 | Wt 171.8 lb

## 2021-01-04 DIAGNOSIS — A6004 Herpesviral vulvovaginitis: Secondary | ICD-10-CM

## 2021-01-04 DIAGNOSIS — O99820 Streptococcus B carrier state complicating pregnancy: Secondary | ICD-10-CM

## 2021-01-04 DIAGNOSIS — Z3480 Encounter for supervision of other normal pregnancy, unspecified trimester: Secondary | ICD-10-CM

## 2021-01-04 NOTE — Progress Notes (Signed)
   PRENATAL VISIT NOTE  Subjective:  Kristin Stokes is a 25 y.o. G3P2002 at [redacted]w[redacted]d being seen today for ongoing prenatal care.  She is currently monitored for the following issues for this low-risk pregnancy and has Genital herpes; Supervision of other normal pregnancy, antepartum; Group B streptococcal carriage complicating pregnancy; and COVID-19 affecting pregnancy in first trimester on their problem list.  Patient reports no complaints.  Contractions: Not present. Vag. Bleeding: None.  Movement: Present. Denies leaking of fluid.   The following portions of the patient's history were reviewed and updated as appropriate: allergies, current medications, past family history, past medical history, past social history, past surgical history and problem list.   Objective:   Vitals:   01/04/21 1501  BP: 106/70  Pulse: (!) 102  Weight: 171 lb 12.8 oz (77.9 kg)    Fetal Status: Fetal Heart Rate (bpm): 153   Movement: Present     General:  Alert, oriented and cooperative. Patient is in no acute distress.  Skin: Skin is warm and dry. No rash noted.   Cardiovascular: Normal heart rate noted  Respiratory: Normal respiratory effort, no problems with respiration noted  Abdomen: Soft, gravid, appropriate for gestational age.  Pain/Pressure: Absent     Pelvic: Cervical exam deferred        Extremities: Normal range of motion.     Mental Status: Normal mood and affect. Normal behavior. Normal judgment and thought content.   Assessment and Plan:  Pregnancy: G3P2002 at [redacted]w[redacted]d 1. Supervision of other normal pregnancy, antepartum Patient is doing well  AFP today Anatomy ultrasound later this week - AFP, Serum, Open Spina Bifida  2. Group B streptococcal carriage complicating pregnancy Prophylaxis in labor  3. Herpes simplex vulvovaginitis   Preterm labor symptoms and general obstetric precautions including but not limited to vaginal bleeding, contractions, leaking of fluid and fetal movement  were reviewed in detail with the patient. Please refer to After Visit Summary for other counseling recommendations.   Return in about 4 weeks (around 02/01/2021) for in person, [name], High risk.  Future Appointments  Date Time Provider Department Center  01/07/2021  1:45 PM [facility]    Catalina Antigua, MD

## 2021-01-06 LAB — AFP, SERUM, OPEN SPINA BIFIDA
AFP MoM: 0.7
AFP Value: 36 ng/mL
Gest. Age on Collection Date: 19.1 weeks
Maternal Age At EDD: 27.2 yr
OSBR Risk 1 IN: 10000
Test Results:: NEGATIVE
Weight: 171 [lb_av]

## 2021-01-07 ENCOUNTER — Ambulatory Visit: Payer: BC Managed Care – PPO | Attending: Obstetrics & Gynecology

## 2021-01-07 ENCOUNTER — Other Ambulatory Visit: Payer: Self-pay

## 2021-01-07 DIAGNOSIS — Z3480 Encounter for supervision of other normal pregnancy, unspecified trimester: Secondary | ICD-10-CM | POA: Diagnosis present

## 2021-01-07 DIAGNOSIS — Z3A10 10 weeks gestation of pregnancy: Secondary | ICD-10-CM | POA: Diagnosis present

## 2021-02-01 ENCOUNTER — Other Ambulatory Visit: Payer: Self-pay

## 2021-02-01 ENCOUNTER — Ambulatory Visit (INDEPENDENT_AMBULATORY_CARE_PROVIDER_SITE_OTHER): Payer: Medicaid Other | Admitting: Obstetrics & Gynecology

## 2021-02-01 VITALS — BP 112/74 | HR 103 | Wt 182.0 lb

## 2021-02-01 DIAGNOSIS — O99820 Streptococcus B carrier state complicating pregnancy: Secondary | ICD-10-CM

## 2021-02-01 DIAGNOSIS — Z3480 Encounter for supervision of other normal pregnancy, unspecified trimester: Secondary | ICD-10-CM

## 2021-02-01 NOTE — Progress Notes (Signed)
   PRENATAL VISIT NOTE  Subjective:  Kristin Stokes is a 25 y.o. G3P2002 at [redacted]w[redacted]d being seen today for ongoing prenatal care.  She is currently monitored for the following issues for this low-risk pregnancy and has Genital herpes; Supervision of other normal pregnancy, antepartum; Group B streptococcal carriage complicating pregnancy; and COVID-19 affecting pregnancy in first trimester on their problem list.  Patient reports no complaints.  Contractions: Not present. Vag. Bleeding: None.  Movement: Present. Denies leaking of fluid.   The following portions of the patient's history were reviewed and updated as appropriate: allergies, current medications, past family history, past medical history, past social history, past surgical history and problem list.   Objective:   Vitals:   02/01/21 1510  BP: 112/74  Pulse: (!) 103  Weight: 182 lb (82.6 kg)    Fetal Status: Fetal Heart Rate (bpm): 150 Fundal Height: 24 cm Movement: Present     General:  Alert, oriented and cooperative. Patient is in no acute distress.  Skin: Skin is warm and dry. No rash noted.   Cardiovascular: Normal heart rate noted  Respiratory: Normal respiratory effort, no problems with respiration noted  Abdomen: Soft, gravid, appropriate for gestational age.  Pain/Pressure: Absent     Pelvic: Cervical exam deferred        Extremities: Normal range of motion.     Mental Status: Normal mood and affect. Normal behavior. Normal judgment and thought content.   Assessment and Plan:  Pregnancy: G3P2002 at [redacted]w[redacted]d 1. Group B streptococcal carriage complicating pregnancy   2. Supervision of other normal pregnancy, antepartum Excessive weight gain discussed and encouraged to decrease gain in the future  Preterm labor symptoms and general obstetric precautions including but not limited to vaginal bleeding, contractions, leaking of fluid and fetal movement were reviewed in detail with the patient. Please refer to After Visit  Summary for other counseling recommendations.   Return in about 4 weeks (around 03/01/2021) for 2 hr GTT.  Future Appointments  Date Time Provider Department Center  03/01/2021  8:00 AM [facility] None  03/01/2021  8:15 AM Constant, Gigi Gin, MD [facility] None    Scheryl Darter, MD

## 2021-02-01 NOTE — Patient Instructions (Signed)

## 2021-03-01 ENCOUNTER — Ambulatory Visit (INDEPENDENT_AMBULATORY_CARE_PROVIDER_SITE_OTHER): Payer: BC Managed Care – PPO | Admitting: Obstetrics and Gynecology

## 2021-03-01 ENCOUNTER — Other Ambulatory Visit: Payer: Self-pay

## 2021-03-01 ENCOUNTER — Encounter: Payer: Self-pay | Admitting: Obstetrics and Gynecology

## 2021-03-01 ENCOUNTER — Other Ambulatory Visit: Payer: Medicaid Other

## 2021-03-01 VITALS — BP 116/74 | HR 98 | Wt 186.0 lb

## 2021-03-01 DIAGNOSIS — O99820 Streptococcus B carrier state complicating pregnancy: Secondary | ICD-10-CM

## 2021-03-01 DIAGNOSIS — Z3480 Encounter for supervision of other normal pregnancy, unspecified trimester: Secondary | ICD-10-CM

## 2021-03-01 DIAGNOSIS — Z23 Encounter for immunization: Secondary | ICD-10-CM

## 2021-03-01 DIAGNOSIS — A6004 Herpesviral vulvovaginitis: Secondary | ICD-10-CM

## 2021-03-01 NOTE — Patient Instructions (Signed)

## 2021-03-01 NOTE — Progress Notes (Signed)
   PRENATAL VISIT NOTE  Subjective:  Kristin Stokes is a 25 y.o. G3P2002 at [redacted]w[redacted]d being seen today for ongoing prenatal care.  She is currently monitored for the following issues for this low-risk pregnancy and has Genital herpes; Supervision of other normal pregnancy, antepartum; Group B streptococcal carriage complicating pregnancy; and COVID-19 affecting pregnancy in first trimester on their problem list.  Patient reports no complaints.  Contractions: Not present. Vag. Bleeding: None.  Movement: Present. Denies leaking of fluid.   The following portions of the patient's history were reviewed and updated as appropriate: allergies, current medications, past family history, past medical history, past social history, past surgical history and problem list.   Objective:   Vitals:   03/01/21 0830  BP: 116/74  Pulse: 98  Weight: 186 lb (84.4 kg)    Fetal Status: Fetal Heart Rate (bpm): 150 Fundal Height: 27 cm Movement: Present     General:  Alert, oriented and cooperative. Patient is in no acute distress.  Skin: Skin is warm and dry. No rash noted.   Cardiovascular: Normal heart rate noted  Respiratory: Normal respiratory effort, no problems with respiration noted  Abdomen: Soft, gravid, appropriate for gestational age.  Pain/Pressure: Absent     Pelvic: Cervical exam deferred        Extremities: Normal range of motion.  Edema: None  Mental Status: Normal mood and affect. Normal behavior. Normal judgment and thought content.   Assessment and Plan:  Pregnancy: G3P2002 at [redacted]w[redacted]d 1. Supervision of other normal pregnancy, antepartum Patient is doing well without complaints Third trimester labs today with glucola Patient undecided on contraception method- information provided  2. Group B streptococcal carriage complicating pregnancy Prophylaxis in labor  3. Herpes simplex vulvovaginitis Suppression starting at 36 weeks  Preterm labor symptoms and general obstetric precautions  including but not limited to vaginal bleeding, contractions, leaking of fluid and fetal movement were reviewed in detail with the patient. Please refer to After Visit Summary for other counseling recommendations.   Return in about 2 weeks (around 03/15/2021) for in person, [name], Low risk.  No future appointments.  Catalina Antigua, MD

## 2021-03-02 LAB — GLUCOSE TOLERANCE, 2 HOURS W/ 1HR
Glucose, 1 hour: 93 mg/dL (ref 65–179)
Glucose, 2 hour: 96 mg/dL (ref 65–152)
Glucose, Fasting: 73 mg/dL (ref 65–91)

## 2021-03-02 LAB — CBC
Hematocrit: 35.3 % (ref 34.0–46.6)
Hemoglobin: 12.5 g/dL (ref 11.1–15.9)
MCH: 31.9 pg (ref 26.6–33.0)
MCHC: 35.4 g/dL (ref 31.5–35.7)
MCV: 90 fL (ref 79–97)
Platelets: 284 10*3/uL (ref 150–450)
RBC: 3.92 x10E6/uL (ref 3.77–5.28)
RDW: 12.5 % (ref 11.7–15.4)
WBC: 9.5 10*3/uL (ref 3.4–10.8)

## 2021-03-02 LAB — HIV ANTIBODY (ROUTINE TESTING W REFLEX): HIV Screen 4th Generation wRfx: NONREACTIVE

## 2021-03-02 LAB — RPR: RPR Ser Ql: NONREACTIVE

## 2021-03-15 ENCOUNTER — Ambulatory Visit (INDEPENDENT_AMBULATORY_CARE_PROVIDER_SITE_OTHER): Payer: Medicaid Other | Admitting: Advanced Practice Midwife

## 2021-03-15 ENCOUNTER — Other Ambulatory Visit: Payer: Self-pay

## 2021-03-15 VITALS — BP 117/74 | HR 99 | Wt 188.0 lb

## 2021-03-15 DIAGNOSIS — O99820 Streptococcus B carrier state complicating pregnancy: Secondary | ICD-10-CM

## 2021-03-15 DIAGNOSIS — Z3480 Encounter for supervision of other normal pregnancy, unspecified trimester: Secondary | ICD-10-CM

## 2021-03-15 DIAGNOSIS — A6004 Herpesviral vulvovaginitis: Secondary | ICD-10-CM

## 2021-03-15 NOTE — Progress Notes (Signed)
   PRENATAL VISIT NOTE  Subjective:  Kristin Stokes is a 25 y.o. G3P2002 at [redacted]w[redacted]d being seen today for ongoing prenatal care.  She is currently monitored for the following issues for this low-risk pregnancy and has Genital herpes; Supervision of other normal pregnancy, antepartum; Group B streptococcal carriage complicating pregnancy; and COVID-19 affecting pregnancy in first trimester on their problem list.  Patient reports no complaints. One brief episode of suprapubic cramping over the weekend, occurred immediately after physically strenuous day and resolved with rest.  Contractions: Not present. Vag. Bleeding: None.  Movement: Present. Denies leaking of fluid.   The following portions of the patient's history were reviewed and updated as appropriate: allergies, current medications, past family history, past medical history, past social history, past surgical history and problem list. Problem list updated.  Objective:   Vitals:   03/15/21 0903  BP: 117/74  Pulse: 99  Weight: 188 lb (85.3 kg)    Fetal Status: Fetal Heart Rate (bpm): 141 Fundal Height: 29 cm Movement: Present     General:  Alert, oriented and cooperative. Patient is in no acute distress.  Skin: Skin is warm and dry. No rash noted.   Cardiovascular: Normal heart rate noted  Respiratory: Normal respiratory effort, no problems with respiration noted  Abdomen: Soft, gravid, appropriate for gestational age.  Pain/Pressure: Present     Pelvic: Cervical exam deferred        Extremities: Normal range of motion.  Edema: None  Mental Status: Normal mood and affect. Normal behavior. Normal judgment and thought content.   Assessment and Plan:  Pregnancy: G3P2002 at [redacted]w[redacted]d  1. Supervision of other normal pregnancy, antepartum - LOB, routine care - Reviewed daily kick counts, interventions for low kick number, indications for eval in [facility]  2. Group B streptococcal carriage complicating pregnancy - Tx in labor  3. Herpes  simplex vulvovaginitis - Initiate suppression at 35 weeks  Preterm labor symptoms and general obstetric precautions including but not limited to vaginal bleeding, contractions, leaking of fluid and fetal movement were reviewed in detail with the patient. Please refer to After Visit Summary for other counseling recommendations.  Return in about 2 weeks (around 03/29/2021) for Any Provider.  Future Appointments  Date Time Provider Department Center  03/29/2021  3:20 PM Leftwich-Kirby, Wilmer Floor, CNM [facility] None    Calvert Cantor, [place]

## 2021-03-15 NOTE — Patient Instructions (Signed)
Third Trimester of Pregnancy  The third trimester of pregnancy is from week 28 through week 40. This is also called months 7 through 9. This trimester is when your unborn baby (fetus) is growing very fast. At the end of the ninth month, the unborn baby is about 20 inches long. It weighs about 6-10 pounds. Body changes during your third trimester Your body continues to go through many changes during this time. The changes vary and generally return to normal after the baby is born. Physical changes  Your weight will continue to increase. You may gain 25-35 pounds (11-16 kg) by the end of the pregnancy. If you are underweight, you may gain 28-40 lb (about 13-18 kg). If you are overweight, you may gain 15-25 lb (about 7-11 kg).  You may start to get stretch marks on your hips, belly (abdomen), and breasts.  Your breasts will continue to grow and may hurt. A yellow fluid (colostrum) may leak from your breasts. This is the first milk you are making for your baby.  You may have changes in your hair.  Your belly button may stick out.  You may have more swelling in your hands, face, or ankles. Health changes  You may have heartburn.  You may have trouble pooping (constipation).  You may get hemorrhoids. These are swollen veins in the butt that can itch or get painful.  You may have swollen veins (varicose veins) in your legs.  You may have more body aches in the pelvis, back, or thighs.  You may have more tingling or numbness in your hands, arms, and legs. The skin on your belly may also feel numb.  You may feel short of breath as your womb (uterus) gets bigger. Other changes  You may pee (urinate) more often.  You may have more problems sleeping.  You may notice the unborn baby "dropping," or moving lower in your belly.  You may have more discharge coming from your vagina.  Your joints may feel loose, and you may have pain around your pelvic bone. Follow these instructions at  home: Medicines  Take over-the-counter and prescription medicines only as told by your doctor. Some medicines are not safe during pregnancy.  Take a prenatal vitamin that contains at least 600 micrograms (mcg) of folic acid. Eating and drinking  Eat healthy meals that include: ? Fresh fruits and vegetables. ? Whole grains. ? Good sources of protein, such as meat, eggs, or tofu. ? Low-fat dairy products.  Avoid raw meat and unpasteurized juice, milk, and cheese. These carry germs that can harm you and your baby.  Eat 4 or 5 small meals rather than 3 large meals a day.  You may need to take these actions to prevent or treat trouble pooping: ? Drink enough fluids to keep your pee (urine) pale yellow. ? Eat foods that are high in fiber. These include beans, whole grains, and fresh fruits and vegetables. ? Limit foods that are high in fat and sugar. These include fried or sweet foods. Activity  Exercise only as told by your doctor. Stop exercising if you start to have cramps in your womb.  Avoid heavy lifting.  Do not exercise if it is too hot or too humid, or if you are in a place of great height (high altitude).  If you choose to, you may have sex unless your doctor tells you not to. Relieving pain and discomfort  Take breaks often, and rest with your legs raised (elevated) if you have   leg cramps or low back pain.  Take warm water baths (sitz baths) to soothe pain or discomfort caused by hemorrhoids. Use hemorrhoid cream if your doctor approves.  Wear a good support bra if your breasts are tender.  If you develop bulging, swollen veins in your legs: ? Wear support hose as told by your doctor. ? Raise your feet for 15 minutes, 3-4 times a day. ? Limit salt in your food. Safety  Talk to your doctor before traveling far distances.  Do not use hot tubs, steam rooms, or saunas.  Wear your seat belt at all times when you are in a car.  Talk with your doctor if someone is  hurting you or yelling at you a lot. Preparing for your baby's arrival To prepare for the arrival of your baby:  Take prenatal classes.  Visit the hospital and tour the maternity area.  Buy a rear-facing car seat. Learn how to install it in your car.  Prepare the baby's room. Take out all pillows and stuffed animals from the baby's crib. General instructions  Avoid cat litter boxes and soil used by cats. These carry germs that can cause harm to the baby and can cause a loss of your baby by miscarriage or stillbirth.  Do not douche or use tampons. Do not use scented sanitary pads.  Do not smoke or use any products that contain nicotine or tobacco. If you need help quitting, ask your doctor.  Do not drink alcohol.  Do not use herbal medicines, illegal drugs, or medicines that were not approved by your doctor. Chemicals in these products can affect your baby.  Keep all follow-up visits. This is important. Where to find more information  American Pregnancy Association: americanpregnancy.org  American College of Obstetricians and Gynecologists: www.acog.org  Office on Women's Health: womenshealth.gov/pregnancy Contact a doctor if:  You have a fever.  You have mild cramps or pressure in your lower belly.  You have a nagging pain in your belly area.  You vomit, or you have watery poop (diarrhea).  You have bad-smelling fluid coming from your vagina.  You have pain when you pee, or your pee smells bad.  You have a headache that does not go away when you take medicine.  You have changes in how you see, or you see spots in front of your eyes. Get help right away if:  Your water breaks.  You have regular contractions that are less than 5 minutes apart.  You are spotting or bleeding from your vagina.  You have very bad belly cramps or pain.  You have trouble breathing.  You have chest pain.  You faint.  You have not felt the baby move for the amount of time told by  your doctor.  You have new or increased pain, swelling, or redness in an arm or leg. Summary  The third trimester is from week 28 through week 40 (months 7 through 9). This is the time when your unborn baby is growing very fast.  During this time, your discomfort may increase as you gain weight and as your baby grows.  Get ready for your baby to arrive by taking prenatal classes, buying a rear-facing car seat, and preparing the baby's room.  Get help right away if you are bleeding from your vagina, you have chest pain and trouble breathing, or you have not felt the baby move for the amount of time told by your doctor. This information is not intended to replace advice given   to you by your health care provider. Make sure you discuss any questions you have with your health care provider. Document Revised: 04/22/2020 Document Reviewed: 02/27/2020 Elsevier Patient Education  2021 Elsevier Inc.   Fetal Movement Counts Patient Name: ________________________________________________ Patient Due Date: ____________________  What is a fetal movement count? A fetal movement count is the number of times that you feel your baby move during a certain amount of time. This may also be called a fetal kick count. A fetal movement count is recommended for every pregnant woman. You may be asked to start counting fetal movements as early as week 28 of your pregnancy. Pay attention to when your baby is most active. You may notice your baby's sleep and wake cycles. You may also notice things that make your baby move more. You should do a fetal movement count:  When your baby is normally most active.  At the same time each day. A good time to count movements is while you are resting, after having something to eat and drink. How do I count fetal movements? 1. Find a quiet, comfortable area. Sit, or lie down on your side. 2. Write down the date, the start time and stop time, and the number of movements that you  felt between those two times. Take this information with you to your health care visits. 3. Write down your start time when you feel the first movement. 4. Count kicks, flutters, swishes, rolls, and jabs. You should feel at least 10 movements. 5. You may stop counting after you have felt 10 movements, or if you have been counting for 2 hours. Write down the stop time. 6. If you do not feel 10 movements in 2 hours, contact your health care provider for further instructions. Your health care provider may want to do additional tests to assess your baby's well-being. Contact a health care provider if:  You feel fewer than 10 movements in 2 hours.  Your baby is not moving like he or she usually does. Date: ____________ Start time: ____________ Stop time: ____________ Movements: ____________ Date: ____________ Start time: ____________ Stop time: ____________ Movements: ____________ Date: ____________ Start time: ____________ Stop time: ____________ Movements: ____________ Date: ____________ Start time: ____________ Stop time: ____________ Movements: ____________ Date: ____________ Start time: ____________ Stop time: ____________ Movements: ____________ Date: ____________ Start time: ____________ Stop time: ____________ Movements: ____________ Date: ____________ Start time: ____________ Stop time: ____________ Movements: ____________ Date: ____________ Start time: ____________ Stop time: ____________ Movements: ____________ Date: ____________ Start time: ____________ Stop time: ____________ Movements: ____________ This information is not intended to replace advice given to you by your health care provider. Make sure you discuss any questions you have with your health care provider. Document Revised: 07/04/2019 Document Reviewed: 07/04/2019 Elsevier Patient Education  2021 Elsevier Inc.  

## 2021-03-15 NOTE — Progress Notes (Signed)
Pt here for [name], Pt has no questions or concerns at this time.

## 2021-03-29 ENCOUNTER — Other Ambulatory Visit: Payer: Self-pay

## 2021-03-29 ENCOUNTER — Ambulatory Visit (INDEPENDENT_AMBULATORY_CARE_PROVIDER_SITE_OTHER): Payer: BC Managed Care – PPO | Admitting: Advanced Practice Midwife

## 2021-03-29 VITALS — BP 116/68 | HR 103 | Wt 193.0 lb

## 2021-03-29 DIAGNOSIS — Z3A31 31 weeks gestation of pregnancy: Secondary | ICD-10-CM

## 2021-03-29 DIAGNOSIS — Z3480 Encounter for supervision of other normal pregnancy, unspecified trimester: Secondary | ICD-10-CM

## 2021-03-29 DIAGNOSIS — A6004 Herpesviral vulvovaginitis: Secondary | ICD-10-CM

## 2021-03-29 DIAGNOSIS — O26893 Other specified pregnancy related conditions, third trimester: Secondary | ICD-10-CM

## 2021-03-29 DIAGNOSIS — R102 Pelvic and perineal pain: Secondary | ICD-10-CM

## 2021-03-29 DIAGNOSIS — O99820 Streptococcus B carrier state complicating pregnancy: Secondary | ICD-10-CM

## 2021-03-29 NOTE — Progress Notes (Signed)
   PRENATAL VISIT NOTE  Subjective:  Kristin Stokes is a 25 y.o. G3P2002 at [redacted]w[redacted]d being seen today for ongoing prenatal care.  She is currently monitored for the following issues for this low-risk pregnancy and has Genital herpes; Supervision of other normal pregnancy, antepartum; Group B streptococcal carriage complicating pregnancy; and COVID-19 affecting pregnancy in first trimester on their problem list.  Patient reports no complaints.  Contractions: Not present. Vag. Bleeding: None.  Movement: Present. Denies leaking of fluid.   The following portions of the patient's history were reviewed and updated as appropriate: allergies, current medications, past family history, past medical history, past social history, past surgical history and problem list.   Objective:   Vitals:   03/29/21 1530  BP: 116/68  Pulse: (!) 103  Weight: 193 lb (87.5 kg)    Fetal Status: Fetal Heart Rate (bpm): 140   Movement: Present     General:  Alert, oriented and cooperative. Patient is in no acute distress.  Skin: Skin is warm and dry. No rash noted.   Cardiovascular: Normal heart rate noted  Respiratory: Normal respiratory effort, no problems with respiration noted  Abdomen: Soft, gravid, appropriate for gestational age.  Pain/Pressure: Present     Pelvic: Cervical exam deferred        Extremities: Normal range of motion.     Mental Status: Normal mood and affect. Normal behavior. Normal judgment and thought content.   Assessment and Plan:  Pregnancy: G3P2002 at [redacted]w[redacted]d 1. Supervision of other normal pregnancy, antepartum --Anticipatory guidance about next visits/weeks of pregnancy given. --Next visit in 3 weeks, at 34 weeks  2. Group B streptococcal carriage complicating pregnancy --Prophylaxis in labor  3. Herpes simplex vulvovaginitis --Valtrex at 36 weeks  4. [redacted] weeks gestation of pregnancy  5. Pelvic pressure in pregnancy, antepartum, third trimester --When standing at work.   --Rest/ice/heat/warm bath/Tylenol/pregnancy support belt   Preterm labor symptoms and general obstetric precautions including but not limited to vaginal bleeding, contractions, leaking of fluid and fetal movement were reviewed in detail with the patient. Please refer to After Visit Summary for other counseling recommendations.   Return in about 2 weeks (around 04/12/2021).  No future appointments.  Sharen Counter, CNM

## 2021-03-29 NOTE — Patient Instructions (Signed)

## 2021-04-19 ENCOUNTER — Encounter: Payer: Self-pay | Admitting: Obstetrics and Gynecology

## 2021-04-19 ENCOUNTER — Ambulatory Visit (INDEPENDENT_AMBULATORY_CARE_PROVIDER_SITE_OTHER): Payer: BC Managed Care – PPO | Admitting: Obstetrics and Gynecology

## 2021-04-19 ENCOUNTER — Other Ambulatory Visit: Payer: Self-pay

## 2021-04-19 VITALS — BP 108/73 | HR 101 | Wt 195.0 lb

## 2021-04-19 DIAGNOSIS — A6004 Herpesviral vulvovaginitis: Secondary | ICD-10-CM

## 2021-04-19 DIAGNOSIS — Z3480 Encounter for supervision of other normal pregnancy, unspecified trimester: Secondary | ICD-10-CM

## 2021-04-19 DIAGNOSIS — O99820 Streptococcus B carrier state complicating pregnancy: Secondary | ICD-10-CM

## 2021-04-19 NOTE — Progress Notes (Signed)
   PRENATAL VISIT NOTE  Subjective:  Kristin Stokes is a 25 y.o. G3P2002 at [redacted]w[redacted]d being seen today for ongoing prenatal care.  She is currently monitored for the following issues for this low-risk pregnancy and has Genital herpes; Supervision of other normal pregnancy, antepartum; Group B streptococcal carriage complicating pregnancy; and COVID-19 affecting pregnancy in first trimester on their problem list.  Patient reports no complaints.  Contractions: Not present. Vag. Bleeding: None.  Movement: Present. Denies leaking of fluid.   The following portions of the patient's history were reviewed and updated as appropriate: allergies, current medications, past family history, past medical history, past social history, past surgical history and problem list.   Objective:   Vitals:   04/19/21 1521  BP: 108/73  Pulse: (!) 101  Weight: 195 lb (88.5 kg)    Fetal Status: Fetal Heart Rate (bpm): 123 Fundal Height: 34 cm Movement: Present     General:  Alert, oriented and cooperative. Patient is in no acute distress.  Skin: Skin is warm and dry. No rash noted.   Cardiovascular: Normal heart rate noted  Respiratory: Normal respiratory effort, no problems with respiration noted  Abdomen: Soft, gravid, appropriate for gestational age.  Pain/Pressure: Absent     Pelvic: Cervical exam deferred        Extremities: Normal range of motion.  Edema: Trace  Mental Status: Normal mood and affect. Normal behavior. Normal judgment and thought content.   Assessment and Plan:  Pregnancy: G3P2002 at [redacted]w[redacted]d 1. Supervision of other normal pregnancy, antepartum Patient is doing well without complaints Cultures next visit  2. Group B streptococcal carriage complicating pregnancy Prophylaxis in labor  3. Herpes simplex vulvovaginitis Prophylaxis at 36 weeks  Preterm labor symptoms and general obstetric precautions including but not limited to vaginal bleeding, contractions, leaking of fluid and fetal  movement were reviewed in detail with the patient. Please refer to After Visit Summary for other counseling recommendations.   Return in about 2 weeks (around 05/03/2021) for in person, [name], Low risk.  No future appointments.  Catalina Antigua, MD

## 2021-05-03 ENCOUNTER — Ambulatory Visit (INDEPENDENT_AMBULATORY_CARE_PROVIDER_SITE_OTHER): Payer: BC Managed Care – PPO | Admitting: Advanced Practice Midwife

## 2021-05-03 ENCOUNTER — Other Ambulatory Visit: Payer: Self-pay

## 2021-05-03 ENCOUNTER — Other Ambulatory Visit (HOSPITAL_COMMUNITY)
Admission: RE | Admit: 2021-05-03 | Discharge: 2021-05-03 | Disposition: A | Discharge status: Home / Self Care | Payer: BC Managed Care – PPO | Source: Ambulatory Visit | Attending: Advanced Practice Midwife | Admitting: Advanced Practice Midwife

## 2021-05-03 VITALS — BP 115/77 | HR 104 | Wt 198.0 lb

## 2021-05-03 DIAGNOSIS — O99820 Streptococcus B carrier state complicating pregnancy: Secondary | ICD-10-CM

## 2021-05-03 DIAGNOSIS — Z3480 Encounter for supervision of other normal pregnancy, unspecified trimester: Secondary | ICD-10-CM

## 2021-05-03 DIAGNOSIS — Z3A36 36 weeks gestation of pregnancy: Secondary | ICD-10-CM

## 2021-05-03 DIAGNOSIS — A6004 Herpesviral vulvovaginitis: Secondary | ICD-10-CM

## 2021-05-03 MED ORDER — VALACYCLOVIR HCL 500 MG PO TABS: 500.0000 mg | ORAL_TABLET | Freq: Two times a day (BID) | ORAL | 0 refills | Status: DC

## 2021-05-03 NOTE — Patient Instructions (Signed)
Things to Try After 37 weeks to Encourage Labor/Get Ready for Labor:   1.  Try the Miles Circuit at www.milescircuit.com daily to improve baby's position and encourage the onset of labor.  2. Walk a little and rest a little every day.  Change positions often.  3. Cervical Ripening: May try one or both a. Red Raspberry Leaf capsules or tea:  two 300mg or 400mg tablets with each meal, 2-3 times a day, or 1-3 cups of tea daily  Potential Side Effects Of Raspberry Leaf:  Most women do not experience any side effects from drinking raspberry leaf tea. However, nausea and loose stools are possible   b. Evening Primrose Oil capsules: take 1 capsule by mouth and place one capsule in the vagina every night.    Some of the potential side effects:  Upset stomach  Loose stools or diarrhea  Headaches  Nausea  4. Sex can also help the cervix ripen and encourage labor onset.    Labor Precautions Reasons to come to [facility] at [facility]:  1.  Contractions are  5 minutes apart or less, each last 1 minute, these have been going on for 1-2 hours, and you cannot walk or talk during them 2.  You have a large gush of fluid, or a trickle of fluid that will not stop and you have to wear a pad 3.  You have bleeding that is bright red, heavier than spotting--like menstrual bleeding (spotting can be normal in early labor or after a check of your cervix) 4.  You do not feel the baby moving like he/she normally does 

## 2021-05-03 NOTE — Progress Notes (Signed)
   PRENATAL VISIT NOTE  Subjective:  Zunairah Devers is a 25 y.o. G3P2002 at [redacted]w[redacted]d being seen today for ongoing prenatal care.  She is currently monitored for the following issues for this low-risk pregnancy and has Genital herpes; Supervision of other normal pregnancy, antepartum; Group B streptococcal carriage complicating pregnancy; and COVID-19 affecting pregnancy in first trimester on their problem list.  Patient reports occasional contractions.  Contractions: Not present. Vag. Bleeding: None.  Movement: Present. Denies leaking of fluid.   The following portions of the patient's history were reviewed and updated as appropriate: allergies, current medications, past family history, past medical history, past social history, past surgical history and problem list.   Objective:   Vitals:   05/03/21 1530  BP: 115/77  Pulse: (!) 104  Weight: 198 lb (89.8 kg)    Fetal Status: Fetal Heart Rate (bpm): 164 Fundal Height: 35 cm Movement: Present  Presentation: Vertex  General:  Alert, oriented and cooperative. Patient is in no acute distress.  Skin: Skin is warm and dry. No rash noted.   Cardiovascular: Normal heart rate noted  Respiratory: Normal respiratory effort, no problems with respiration noted  Abdomen: Soft, gravid, appropriate for gestational age.  Pain/Pressure: Absent     Pelvic: Cervical exam performed in the presence of a chaperone Dilation: 1 Effacement (%): 50 Station: -2  Extremities: Normal range of motion.  Edema: Trace  Mental Status: Normal mood and affect. Normal behavior. Normal judgment and thought content.   Assessment and Plan:  Pregnancy: G3P2002 at [redacted]w[redacted]d 1. Supervision of other normal pregnancy, antepartum --Anticipatory guidance about next visits/weeks of pregnancy given. --Reviewed labor readiness with patient including the [facility] [employer], evening primrose oil, and raspberry leaf tea.  --Next visit in 1 week  2. Group B streptococcal carriage complicating  pregnancy --Prophylaxis in labor  3. Herpes simplex vulvovaginitis --Start Valtrex 500 mg BID for prophylaxis  4. [redacted] weeks gestation of pregnancy   Preterm labor symptoms and general obstetric precautions including but not limited to vaginal bleeding, contractions, leaking of fluid and fetal movement were reviewed in detail with the patient. Please refer to After Visit Summary for other counseling recommendations.   Return in about 1 week (around 05/10/2021).  Future Appointments  Date Time Provider Department Center  05/10/2021  8:45 AM Hermina Staggers, MD [facility] None    Sharen Counter, CNM

## 2021-05-04 LAB — GC/CHLAMYDIA PROBE AMP (~~LOC~~) NOT AT ARMC
Chlamydia: NEGATIVE
Comment: NEGATIVE
Comment: NORMAL
Neisseria Gonorrhea: NEGATIVE

## 2021-05-06 ENCOUNTER — Other Ambulatory Visit: Payer: Self-pay

## 2021-05-06 ENCOUNTER — Encounter (HOSPITAL_COMMUNITY): Payer: Self-pay | Admitting: Obstetrics and Gynecology

## 2021-05-06 ENCOUNTER — Inpatient Hospital Stay (HOSPITAL_COMMUNITY)
Admission: AD | Admit: 2021-05-06 | Discharge: 2021-05-06 | Disposition: A | Discharge status: Home / Self Care | Payer: BC Managed Care – PPO | Attending: Obstetrics and Gynecology | Admitting: Obstetrics and Gynecology

## 2021-05-06 DIAGNOSIS — O99820 Streptococcus B carrier state complicating pregnancy: Secondary | ICD-10-CM

## 2021-05-06 DIAGNOSIS — Z3689 Encounter for other specified antenatal screening: Secondary | ICD-10-CM | POA: Diagnosis not present

## 2021-05-06 DIAGNOSIS — O4703 False labor before 37 completed weeks of gestation, third trimester: Secondary | ICD-10-CM

## 2021-05-06 DIAGNOSIS — Z3A36 36 weeks gestation of pregnancy: Secondary | ICD-10-CM

## 2021-05-06 DIAGNOSIS — O479 False labor, unspecified: Secondary | ICD-10-CM

## 2021-05-06 LAB — URINALYSIS, ROUTINE W REFLEX MICROSCOPIC
Bilirubin Urine: NEGATIVE
Glucose, UA: NEGATIVE mg/dL
Hgb urine dipstick: NEGATIVE
Ketones, ur: 20 mg/dL — AB
Leukocytes,Ua: NEGATIVE
Nitrite: NEGATIVE
Protein, ur: NEGATIVE mg/dL
Specific Gravity, Urine: 1.017 (ref 1.005–1.030)
pH: 6 (ref 5.0–8.0)

## 2021-05-06 NOTE — MAU Provider Note (Signed)
  S: Ms. Kristin Stokes is a 25 y.o. G3P2002 at [redacted]w[redacted]d  who presents to MAU today complaining contractions today. She denies vaginal bleeding. She denies LOF. She reports normal fetal movement.    O: BP 117/73 (BP Location: Right Arm)   Pulse (!) 103   Temp 98.7 F (37.1 C) (Oral)   Resp 17   Ht 5\' 9"  (1.753 m)   Wt 88.5 kg   LMP 08/14/2020 (Exact Date)   SpO2 100%   BMI 28.81 kg/m   Cervical exam:  Dilation: 1.5 Effacement (%): 50 Cervical Position: Anterior Station: -2 Presentation: Vertex Exam by:: tlytle RN  Fetal Monitoring: Baseline: 140 Variability: mod Accelerations: + Decelerations: no Contractions: irregular   A: SIUP at [redacted]w[redacted]d  False labor Reactive NST  P: Discharge home Follow up @[facility]  tomorrow Labor precautions  [redacted]w[redacted]d, CNM 05/06/2021 5:58 PM

## 2021-05-06 NOTE — MAU Note (Signed)
I have communicated with Fabian November CNM and reviewed vital signs:  Vitals:   05/06/21 1646 05/06/21 1813  BP: 117/73 113/69  Pulse: (!) 103 (!) 127  Resp: 17 17  Temp: 98.7 F (37.1 C)   SpO2: 100% 99%    Vaginal exam:  Dilation: 1.5 Effacement (%): 50 Cervical Position: Anterior Station: -2 Presentation: Vertex Exam by:: tlytle RN,   Also reviewed contraction pattern and that non-stress test is reactive.  It has been documented that patient is contracting every iirminutes with no cervical change since last SVE at office  not indicating active labor.  Patient denies any other complaints.  Based on this report provider has given order for discharge.  A discharge order and diagnosis entered by a provider.   Labor discharge instructions reviewed with patient.

## 2021-05-06 NOTE — MAU Note (Signed)
Been contracting all day and having some lower back pain. Denies hx of PTL or delivery.  Denies bleeding or leaking. On Monday was 1/50.  Contractions are not painful, low back is aching.

## 2021-05-10 ENCOUNTER — Other Ambulatory Visit: Payer: Self-pay

## 2021-05-10 ENCOUNTER — Encounter: Payer: Self-pay | Admitting: Obstetrics and Gynecology

## 2021-05-10 ENCOUNTER — Ambulatory Visit (INDEPENDENT_AMBULATORY_CARE_PROVIDER_SITE_OTHER): Payer: BC Managed Care – PPO | Admitting: Obstetrics and Gynecology

## 2021-05-10 VITALS — BP 109/73 | HR 92 | Wt 194.0 lb

## 2021-05-10 DIAGNOSIS — Z3480 Encounter for supervision of other normal pregnancy, unspecified trimester: Secondary | ICD-10-CM

## 2021-05-10 DIAGNOSIS — Z8619 Personal history of other infectious and parasitic diseases: Secondary | ICD-10-CM | POA: Insufficient documentation

## 2021-05-10 DIAGNOSIS — O99820 Streptococcus B carrier state complicating pregnancy: Secondary | ICD-10-CM

## 2021-05-10 NOTE — Progress Notes (Signed)
[name] 37 w1d GBS+ in urine per notes GC/CT :Neg on 05/03/21  Last week mucous plug came out.  Pt request cervix check today.

## 2021-05-10 NOTE — Progress Notes (Signed)
Subjective:  Kristin Stokes is a 25 y.o. G3P2002 at [redacted]w[redacted]d being seen today for ongoing prenatal care.  She is currently monitored for the following issues for this low-risk pregnancy and has Supervision of other normal pregnancy, antepartum; Group B streptococcal carriage complicating pregnancy; and History of ELISA positive for HSV on their problem list.  Patient reports general discomforts of pregnancy.  Contractions: Irritability. Vag. Bleeding: None.  Movement: Present. Denies leaking of fluid.   The following portions of the patient's history were reviewed and updated as appropriate: allergies, current medications, past family history, past medical history, past social history, past surgical history and problem list. Problem list updated.  Objective:   Vitals:   05/10/21 0859  BP: 109/73  Pulse: 92  Weight: 194 lb (88 kg)    Fetal Status: Fetal Heart Rate (bpm): 147   Movement: Present     General:  Alert, oriented and cooperative. Patient is in no acute distress.  Skin: Skin is warm and dry. No rash noted.   Cardiovascular: Normal heart rate noted  Respiratory: Normal respiratory effort, no problems with respiration noted  Abdomen: Soft, gravid, appropriate for gestational age. Pain/Pressure: Present     Pelvic:  Cervical exam performed        Extremities: Normal range of motion.  Edema: Trace  Mental Status: Normal mood and affect. Normal behavior. Normal judgment and thought content.   Urinalysis:      Assessment and Plan:  Pregnancy: G3P2002 at [redacted]w[redacted]d  1. Supervision of other normal pregnancy, antepartum Labor precautions  2. Group B streptococcal carriage complicating pregnancy Tx while in labor  3. History of ELISA positive for HSV Continue with suppression  Term labor symptoms and general obstetric precautions including but not limited to vaginal bleeding, contractions, leaking of fluid and fetal movement were reviewed in detail with the patient. Please refer to  After Visit Summary for other counseling recommendations.  Return in about 1 week (around 05/17/2021) for OB visit, face to face, any provider.   Hermina Staggers, MD

## 2021-05-10 NOTE — Patient Instructions (Signed)

## 2021-05-17 ENCOUNTER — Other Ambulatory Visit: Payer: Self-pay

## 2021-05-17 ENCOUNTER — Ambulatory Visit (INDEPENDENT_AMBULATORY_CARE_PROVIDER_SITE_OTHER): Payer: BC Managed Care – PPO | Admitting: Obstetrics & Gynecology

## 2021-05-17 VITALS — BP 115/74 | HR 93 | Wt 199.0 lb

## 2021-05-17 DIAGNOSIS — Z3480 Encounter for supervision of other normal pregnancy, unspecified trimester: Secondary | ICD-10-CM

## 2021-05-17 DIAGNOSIS — O99820 Streptococcus B carrier state complicating pregnancy: Secondary | ICD-10-CM

## 2021-05-17 DIAGNOSIS — Z8619 Personal history of other infectious and parasitic diseases: Secondary | ICD-10-CM

## 2021-05-17 NOTE — Progress Notes (Signed)
[name] wants her membranes sweep today.

## 2021-05-17 NOTE — Progress Notes (Signed)
   PRENATAL VISIT NOTE  Subjective:  Kristin Stokes is a 25 y.o. G3P2002 at [redacted]w[redacted]d being seen today for ongoing prenatal care.  She is currently monitored for the following issues for this low-risk pregnancy and has Supervision of other normal pregnancy, antepartum; Group B streptococcal carriage complicating pregnancy; and History of ELISA positive for HSV on their problem list.  Patient reports occasional contractions.  Contractions: Irritability. Vag. Bleeding: None.  Movement: Present. Denies leaking of fluid.   The following portions of the patient's history were reviewed and updated as appropriate: allergies, current medications, past family history, past medical history, past social history, past surgical history and problem list.   Objective:   Vitals:   05/17/21 1443  BP: 115/74  Pulse: 93  Weight: 199 lb (90.3 kg)    Fetal Status: Fetal Heart Rate (bpm): 150   Movement: Present  Presentation: Vertex  General:  Alert, oriented and cooperative. Patient is in no acute distress.  Skin: Skin is warm and dry. No rash noted.   Cardiovascular: Normal heart rate noted  Respiratory: Normal respiratory effort, no problems with respiration noted  Abdomen: Soft, gravid, appropriate for gestational age.  Pain/Pressure: Present     Pelvic: Cervical exam performed in the presence of a chaperone Dilation: 1 Effacement (%): 50 Station: -3  Extremities: Normal range of motion.  Edema: None  Mental Status: Normal mood and affect. Normal behavior. Normal judgment and thought content.   Assessment and Plan:  Pregnancy: G3P2002 at [redacted]w[redacted]d 1. Supervision of other normal pregnancy, antepartum Will offer membrane strip next visit  2. Group B streptococcal carriage complicating pregnancy abx in labor  3. History of ELISA positive for HSV Valtrex  Term labor symptoms and general obstetric precautions including but not limited to vaginal bleeding, contractions, leaking of fluid and fetal movement  were reviewed in detail with the patient. Please refer to After Visit Summary for other counseling recommendations.   Return in about 1 week (around 05/24/2021).  No future appointments.  Scheryl Darter, MD

## 2021-05-24 ENCOUNTER — Telehealth (HOSPITAL_COMMUNITY): Payer: Self-pay | Admitting: *Deleted

## 2021-05-24 ENCOUNTER — Ambulatory Visit (INDEPENDENT_AMBULATORY_CARE_PROVIDER_SITE_OTHER): Payer: BC Managed Care – PPO | Admitting: Nurse Practitioner

## 2021-05-24 ENCOUNTER — Other Ambulatory Visit: Payer: Self-pay

## 2021-05-24 ENCOUNTER — Encounter (HOSPITAL_COMMUNITY): Payer: Self-pay | Admitting: *Deleted

## 2021-05-24 VITALS — BP 116/74 | HR 128 | Wt 198.0 lb

## 2021-05-24 DIAGNOSIS — A6004 Herpesviral vulvovaginitis: Secondary | ICD-10-CM

## 2021-05-24 DIAGNOSIS — Z3480 Encounter for supervision of other normal pregnancy, unspecified trimester: Secondary | ICD-10-CM

## 2021-05-24 DIAGNOSIS — O99820 Streptococcus B carrier state complicating pregnancy: Secondary | ICD-10-CM

## 2021-05-24 DIAGNOSIS — Z3A39 39 weeks gestation of pregnancy: Secondary | ICD-10-CM

## 2021-05-24 NOTE — Addendum Note (Signed)
Addended by: Currie Paris on: 05/24/2021 03:53 PM   Modules accepted: Orders, SmartSet

## 2021-05-24 NOTE — Telephone Encounter (Signed)
Preadmission screen  

## 2021-05-24 NOTE — Progress Notes (Signed)
[name] wants membranes sweep today.

## 2021-05-24 NOTE — Progress Notes (Signed)
    Subjective:  Kristin Stokes is a 25 y.o. G3P2002 at [redacted]w[redacted]d being seen today for ongoing prenatal care.  She is currently monitored for the following issues for this low-risk pregnancy and has Supervision of other normal pregnancy, antepartum; Group B streptococcal carriage complicating pregnancy; and History of ELISA positive for HSV on their problem list.  Patient reports occasional contractions.  Contractions: Irregular. Vag. Bleeding: None.  Movement: Present. Denies leaking of fluid.   The following portions of the patient's history were reviewed and updated as appropriate: allergies, current medications, past family history, past medical history, past social history, past surgical history and problem list. Problem list updated.  Objective:   Vitals:   05/24/21 1406  BP: 116/74  Pulse: (!) 128  Weight: 198 lb (89.8 kg)    Fetal Status: Fetal Heart Rate (bpm): 158 Fundal Height: 38 cm Movement: Present  Presentation: Vertex  General:  Alert, oriented and cooperative. Patient is in no acute distress.  Skin: Skin is warm and dry. No rash noted.   Cardiovascular: Normal heart rate noted  Respiratory: Normal respiratory effort, no problems with respiration noted  Abdomen: Soft, gravid, appropriate for gestational age. Pain/Pressure: Present     Pelvic:  Cervical exam performed Dilation: 1.5 Effacement (%): 60 Station: -2  Extremities: Normal range of motion.  Edema: None  Mental Status: Normal mood and affect. Normal behavior. Normal judgment and thought content.   Urinalysis:      Assessment and Plan:  Pregnancy: G3P2002 at [redacted]w[redacted]d  1. Supervision of other normal pregnancy, antepartum Membranes stripped today - states with other pregnancies, she goes into labor when membranes are stripped Will set up for induction around 40 weeks Baby has been moving well Wants to use progestin only pills as contraception - may want to have another baby.  2. Group B streptococcal carriage  complicating pregnancy Will treat in labor  3. Herpes simplex vulvovaginitis Taking suppression medication  Term labor symptoms and general obstetric precautions including but not limited to vaginal bleeding, contractions, leaking of fluid and fetal movement were reviewed in detail with the patient. Please refer to After Visit Summary for other counseling recommendations.  Return in about 1 week (around 05/31/2021) for in person [name].  Nolene Bernheim, RN, MSN, NP-BC Nurse Practitioner, [facility] for [employer], [facility] 05/24/2021 2:26 PM

## 2021-05-25 ENCOUNTER — Other Ambulatory Visit: Payer: Self-pay | Admitting: Women's Health

## 2021-05-28 ENCOUNTER — Other Ambulatory Visit (HOSPITAL_COMMUNITY)
Admission: RE | Admit: 2021-05-28 | Discharge: 2021-05-28 | Disposition: A | Discharge status: Home / Self Care | Payer: BC Managed Care – PPO | Source: Ambulatory Visit | Attending: Obstetrics & Gynecology | Admitting: Obstetrics & Gynecology

## 2021-05-28 DIAGNOSIS — Z01812 Encounter for preprocedural laboratory examination: Secondary | ICD-10-CM | POA: Insufficient documentation

## 2021-05-28 DIAGNOSIS — Z20822 Contact with and (suspected) exposure to covid-19: Secondary | ICD-10-CM | POA: Insufficient documentation

## 2021-05-28 LAB — SARS CORONAVIRUS 2 (TAT 6-24 HRS): SARS Coronavirus 2: NEGATIVE

## 2021-05-30 ENCOUNTER — Other Ambulatory Visit: Payer: Self-pay

## 2021-05-30 ENCOUNTER — Inpatient Hospital Stay (HOSPITAL_COMMUNITY)
Admission: AD | Admit: 2021-05-30 | Payer: BC Managed Care – PPO | Source: Home / Self Care | Admitting: Obstetrics & Gynecology

## 2021-05-30 ENCOUNTER — Inpatient Hospital Stay (HOSPITAL_COMMUNITY): Payer: BC Managed Care – PPO

## 2021-05-30 ENCOUNTER — Encounter (HOSPITAL_COMMUNITY): Payer: Self-pay | Admitting: Obstetrics & Gynecology

## 2021-05-30 ENCOUNTER — Inpatient Hospital Stay (HOSPITAL_COMMUNITY)
Admission: AD | Admit: 2021-05-30 | Discharge: 2021-06-01 | DRG: 806 | Disposition: A | Discharge status: Home / Self Care | Payer: BC Managed Care – PPO | Attending: Obstetrics and Gynecology | Admitting: Obstetrics and Gynecology

## 2021-05-30 DIAGNOSIS — O326XX0 Maternal care for compound presentation, not applicable or unspecified: Secondary | ICD-10-CM

## 2021-05-30 DIAGNOSIS — A6000 Herpesviral infection of urogenital system, unspecified: Secondary | ICD-10-CM | POA: Diagnosis present

## 2021-05-30 DIAGNOSIS — O99824 Streptococcus B carrier state complicating childbirth: Secondary | ICD-10-CM

## 2021-05-30 DIAGNOSIS — O99820 Streptococcus B carrier state complicating pregnancy: Secondary | ICD-10-CM

## 2021-05-30 DIAGNOSIS — Z8616 Personal history of COVID-19: Secondary | ICD-10-CM

## 2021-05-30 DIAGNOSIS — O26893 Other specified pregnancy related conditions, third trimester: Secondary | ICD-10-CM | POA: Diagnosis present

## 2021-05-30 DIAGNOSIS — O9832 Other infections with a predominantly sexual mode of transmission complicating childbirth: Secondary | ICD-10-CM | POA: Diagnosis present

## 2021-05-30 DIAGNOSIS — Z91040 Latex allergy status: Secondary | ICD-10-CM

## 2021-05-30 DIAGNOSIS — Z3A40 40 weeks gestation of pregnancy: Secondary | ICD-10-CM

## 2021-05-30 DIAGNOSIS — O480 Post-term pregnancy: Principal | ICD-10-CM | POA: Diagnosis present

## 2021-05-30 LAB — CBC
HCT: 37.4 % (ref 36.0–46.0)
Hemoglobin: 12.8 g/dL (ref 12.0–15.0)
MCH: 29.3 pg (ref 26.0–34.0)
MCHC: 34.2 g/dL (ref 30.0–36.0)
MCV: 85.6 fL (ref 80.0–100.0)
Platelets: 270 10*3/uL (ref 150–400)
RBC: 4.37 MIL/uL (ref 3.87–5.11)
RDW: 13.1 % (ref 11.5–15.5)
WBC: 8.5 10*3/uL (ref 4.0–10.5)
nRBC: 0 % (ref 0.0–0.2)

## 2021-05-30 LAB — TYPE AND SCREEN
ABO/RH(D): O POS
Antibody Screen: NEGATIVE

## 2021-05-30 LAB — RPR: RPR Ser Ql: NONREACTIVE

## 2021-05-30 MED ORDER — SODIUM CHLORIDE 0.9 % IV SOLN
5.0000 10*6.[IU] | Freq: Once | INTRAVENOUS | Status: AC
  Administered 2021-05-30: 5 10*6.[IU] via INTRAVENOUS
  Filled 2021-05-30: qty 5

## 2021-05-30 MED ORDER — IBUPROFEN 600 MG PO TABS
600.0000 mg | ORAL_TABLET | Freq: Four times a day (QID) | ORAL | Status: DC
  Administered 2021-05-30 – 2021-06-01 (×8): 600 mg via ORAL
  Filled 2021-05-30 (×8): qty 1

## 2021-05-30 MED ORDER — MISOPROSTOL 25 MCG QUARTER TABLET: 25.0000 ug | ORAL_TABLET | ORAL | Status: DC | PRN

## 2021-05-30 MED ORDER — LACTATED RINGERS IV SOLN: INTRAVENOUS | Status: DC

## 2021-05-30 MED ORDER — FLEET ENEMA 7-19 GM/118ML RE ENEM: 1.0000 | ENEMA | RECTAL | Status: DC | PRN

## 2021-05-30 MED ORDER — ACETAMINOPHEN 325 MG PO TABS
650.0000 mg | ORAL_TABLET | Freq: Four times a day (QID) | ORAL | Status: DC
  Administered 2021-05-30 – 2021-06-01 (×8): 650 mg via ORAL
  Filled 2021-05-30 (×8): qty 2

## 2021-05-30 MED ORDER — BENZOCAINE-MENTHOL 20-0.5 % EX AERO: 1.0000 "application " | INHALATION_SPRAY | CUTANEOUS | Status: DC | PRN

## 2021-05-30 MED ORDER — OXYTOCIN-SODIUM CHLORIDE 30-0.9 UT/500ML-% IV SOLN
2.5000 [IU]/h | INTRAVENOUS | Status: DC
  Filled 2021-05-30: qty 500

## 2021-05-30 MED ORDER — PENICILLIN G POT IN DEXTROSE 60000 UNIT/ML IV SOLN: 3.0000 10*6.[IU] | INTRAVENOUS | Status: DC

## 2021-05-30 MED ORDER — ONDANSETRON HCL 4 MG/2ML IJ SOLN: 4.0000 mg | INTRAMUSCULAR | Status: DC | PRN

## 2021-05-30 MED ORDER — COCONUT OIL OIL: 1.0000 "application " | TOPICAL_OIL | Status: DC | PRN

## 2021-05-30 MED ORDER — OXYTOCIN-SODIUM CHLORIDE 30-0.9 UT/500ML-% IV SOLN
1.0000 m[IU]/min | INTRAVENOUS | Status: DC
Start: 2021-05-30 — End: 2021-05-30

## 2021-05-30 MED ORDER — FENTANYL CITRATE (PF) 100 MCG/2ML IJ SOLN
100.0000 ug | INTRAMUSCULAR | Status: DC | PRN
Start: 2021-05-30 — End: 2021-05-30
  Administered 2021-05-30: 100 ug via INTRAVENOUS
  Filled 2021-05-30: qty 2

## 2021-05-30 MED ORDER — PRENATAL MULTIVITAMIN CH
1.0000 | ORAL_TABLET | Freq: Every day | ORAL | Status: DC
  Administered 2021-05-30 – 2021-05-31 (×2): 1 via ORAL
  Filled 2021-05-30 (×2): qty 1

## 2021-05-30 MED ORDER — LIDOCAINE HCL (PF) 1 % IJ SOLN
30.0000 mL | INTRAMUSCULAR | Status: DC | PRN
Start: 2021-05-30 — End: 2021-05-30

## 2021-05-30 MED ORDER — SENNOSIDES-DOCUSATE SODIUM 8.6-50 MG PO TABS
2.0000 | ORAL_TABLET | Freq: Every day | ORAL | Status: DC
  Administered 2021-05-31 – 2021-06-01 (×2): 2 via ORAL
  Filled 2021-05-30 (×3): qty 2

## 2021-05-30 MED ORDER — SOD CITRATE-CITRIC ACID 500-334 MG/5ML PO SOLN: 30.0000 mL | ORAL | Status: DC | PRN

## 2021-05-30 MED ORDER — SIMETHICONE 80 MG PO CHEW: 80.0000 mg | CHEWABLE_TABLET | ORAL | Status: DC | PRN

## 2021-05-30 MED ORDER — OXYCODONE-ACETAMINOPHEN 5-325 MG PO TABS: 2.0000 | ORAL_TABLET | ORAL | Status: DC | PRN

## 2021-05-30 MED ORDER — ONDANSETRON HCL 4 MG PO TABS: 4.0000 mg | ORAL_TABLET | ORAL | Status: DC | PRN

## 2021-05-30 MED ORDER — WITCH HAZEL-GLYCERIN EX PADS: 1.0000 "application " | MEDICATED_PAD | CUTANEOUS | Status: DC | PRN

## 2021-05-30 MED ORDER — OXYTOCIN BOLUS FROM INFUSION
333.0000 mL | Freq: Once | INTRAVENOUS | Status: AC
  Administered 2021-05-30: 333 mL via INTRAVENOUS

## 2021-05-30 MED ORDER — LACTATED RINGERS IV SOLN
500.0000 mL | INTRAVENOUS | Status: DC | PRN
  Administered 2021-05-30: 1000 mL via INTRAVENOUS

## 2021-05-30 MED ORDER — DIPHENHYDRAMINE HCL 25 MG PO CAPS: 25.0000 mg | ORAL_CAPSULE | Freq: Four times a day (QID) | ORAL | Status: DC | PRN

## 2021-05-30 MED ORDER — OXYCODONE-ACETAMINOPHEN 5-325 MG PO TABS: 1.0000 | ORAL_TABLET | ORAL | Status: DC | PRN

## 2021-05-30 MED ORDER — TETANUS-DIPHTH-ACELL PERTUSSIS 5-2.5-18.5 LF-MCG/0.5 IM SUSY: 0.5000 mL | PREFILLED_SYRINGE | Freq: Once | INTRAMUSCULAR | Status: DC

## 2021-05-30 MED ORDER — LACTATED RINGERS AMNIOINFUSION: INTRAVENOUS | Status: DC

## 2021-05-30 MED ORDER — ACETAMINOPHEN 325 MG PO TABS: 650.0000 mg | ORAL_TABLET | ORAL | Status: DC | PRN

## 2021-05-30 MED ORDER — DIBUCAINE (PERIANAL) 1 % EX OINT: 1.0000 "application " | TOPICAL_OINTMENT | CUTANEOUS | Status: DC | PRN

## 2021-05-30 MED ORDER — ONDANSETRON HCL 4 MG/2ML IJ SOLN: 4.0000 mg | Freq: Four times a day (QID) | INTRAMUSCULAR | Status: DC | PRN

## 2021-05-30 MED ORDER — TERBUTALINE SULFATE 1 MG/ML IJ SOLN: 0.2500 mg | Freq: Once | INTRAMUSCULAR | Status: DC | PRN

## 2021-05-30 NOTE — Progress Notes (Signed)
To BS via w/c °

## 2021-05-30 NOTE — Progress Notes (Signed)
Pt sitting on bedside. Transducer adjusted.

## 2021-05-30 NOTE — MAU Note (Signed)
Ctxs since 0230. Leaked fld one time earlier but none since. For IOL this am.

## 2021-05-30 NOTE — H&P (Signed)
OBSTETRIC ADMISSION HISTORY AND PHYSICAL  Kristin Stokes is a 25 y.o. female G3P2002 with IUP at 21w0dby 9 week ultrasound presenting for spontaneous onset of labor. She reports +FMs, No LOF, no VB, no blurry vision, headaches or peripheral edema, and RUQ pain.  She plans on breast feeding. She requests POPs for birth control. [place]  [facility] [place] [place]  Estimated Date of Delivery: 05/30/21  Sono:  _0 , CWD, normal anatomy, cephalic presentation, 3719L 61% EFW   Prenatal History/Complications:  - H/o HSV (good adherence to valtrex) - Latex allergy  Past Medical History: Past Medical History:  Diagnosis Date   Anemia    COVID-19 11/17/2020   Genital herpes     Past Surgical History: Past Surgical History:  Procedure Laterality Date   ANKLE SURGERY Right     Obstetrical History: OB History     Gravida  3   Para  2   Term  2   Preterm      AB      Living  2      SAB      IAB      Ectopic      Multiple  0   Live Births  2           Social History Social History   Socioeconomic History   Marital status: Married    Spouse name: Not on file   Number of children: Not on file   Years of education: Not on file   Highest education level: Not on file  Occupational History   Not on file  Tobacco Use   Smoking status: Never   Smokeless tobacco: Never  Vaping Use   Vaping Use: Never used  Substance and Sexual Activity   Alcohol use: Not Currently    Comment: Occasionally   Drug use: Not Currently    Comment: not since confirmed pregnancy   Sexual activity: Yes    Partners: Male    Birth control/protection: None  Other Topics Concern   Not on file  Social History Narrative   Not on file   Social Determinants of Health   Financial Resource Strain: Not on file  Food Insecurity: Not on file  Transportation Needs: Not on file  Physical Activity: Not on file  Stress: Not on file  Social  Connections: Not on file    Family History: Family History  Problem Relation Age of Onset   Asthma Mother    Anxiety disorder Father    Hypertension Other    Diabetes Other    Cancer Other    COPD Maternal Grandmother    Asthma Maternal Grandmother    Hypertension Maternal Grandmother     Allergies: Allergies  Allergen Reactions   Latex Rash    Medications Prior to Admission  Medication Sig Dispense Refill Last Dose   Prenatal Vit-Fe Fumarate-FA (PRENATAL VITAMIN) 27-0.8 MG TABS Take 1 tablet by mouth daily. 30 tablet 12 05/29/2021   valACYclovir (VALTREX) 1000 MG tablet Take 1,000 mg by mouth 2 (two) times daily.   05/29/2021   Blood Pressure Monitoring (BLOOD PRESSURE KIT) DEVI 1 kit by Does not apply route once a week. Check Blood Pressure regularly and record readings into the Babyscripts App.  Large Cuff.  DX O90.0 1 each 0      Review of Systems   All systems reviewed and negative except as stated in HPI  Blood pressure 115/72,  pulse (!) 101, temperature (!) 97.3 F (36.3 C), resp. rate 17, height _0  (1.753 m), weight 90.7 kg, last menstrual period 08/14/2020, currently breastfeeding. General appearance: alert, cooperative, and appears stated age Lungs: normal WOB Heart: regular rate Abdomen: soft, non-tender Extremities: no sign of DVT Presentation: cephalic Fetal monitoringBaseline: 140 bpm, Variability: Good {> 6 bpm), Accelerations: none, and Decelerations: Absent Uterine activityFrequency: Every 2-4 minutes Dilation: 5 Effacement (%): 80 Station: -2 Exam by:: Guinevere Ferrari, RN   Prenatal labs: ABO, Rh: O/Positive/-- (12/09 1626) Antibody: Negative (12/09 1626) Rubella: 1.58 (12/09 1626) RPR: Non Reactive (04/04 0922)  HBsAg: Negative (12/09 1626)  HIV: Non Reactive (04/04 0922)  GBS:   positive 2 hr Glucola wnl Genetic screening wnl Anatomy US wnl  Prenatal Transfer Tool  Maternal Diabetes: No Genetic Screening: Normal Maternal  Ultrasounds/Referrals: Normal Fetal Ultrasounds or other Referrals:  None Maternal Substance Abuse:  No Significant Maternal Medications:  None Significant Maternal Lab Results: Group B Strep positive  No results found for this or any previous visit (from the past 24 hour(s)).  Patient Active Problem List   Diagnosis Date Noted   Post-dates pregnancy 05/30/2021   History of ELISA positive for HSV 05/10/2021   Group B streptococcal carriage complicating pregnancy 65/99/3570   Supervision of other normal pregnancy, antepartum 10/06/2020    Assessment/Plan:  Kristin Stokes is a 25 y.o. G3P2002 at 38w0dhere for spontaneous onset of labor.  #Labor: Will plan to manage expectantly and augment as clinically indicated. #Pain: TBD per pt preference #FWB: Category 1 strip but not yet reactive #ID: GBS+ >PCN started on admission #MOF: breast #MOC: plan for POPs #Circ: n/a #H/o HSV: good adherence to valtrex. No visible lesions or prodromal symptoms on admission.  ARanda Ngo MD  05/30/2021, 4:13 AM

## 2021-05-30 NOTE — Discharge Summary (Signed)
Postpartum Discharge Summary       Patient Name: Kristin Stokes DOB: 1996-11-21 MRN: [id number]  Date of admission: 05/30/2021 Delivery date:05/30/2021  Delivering provider: Randa Ngo  Date of discharge: 06/01/2021  Admitting diagnosis: Post-dates pregnancy [O48.0] Intrauterine pregnancy: [redacted]w[redacted]d    Secondary diagnosis:  Principal Problem:   Vaginal delivery Active Problems:   Group B streptococcal carriage complicating pregnancy   Post-dates pregnancy   Shoulder dystocia, delivered  Additional problems: as noted above   Discharge diagnosis: Term Pregnancy Delivered                                              Post partum procedures:none Augmentation: AROM Complications: Brief Shoulder Dystocia (approximately 30-seconds; occurred in context of compound presentation with left fetal hand; resolved s/p McRoberts & delivery of posterior arm)  Hospital course: Onset of Labor With Vaginal Delivery      25y.o. yo G3P3003 at 457w0das admitted in Latent Labor on 05/30/2021. Pt progressed to 8cm of cervical dilation without augmentation. Given recurrent, deep variable decels, AROM for thick meconium-stained fluid was performed at 0649. IUPC was subsequently placed and amnioinfusion was started. Pt then rapidly progressed to complete cervical dilation at 0705. Delivery as noted below was complicated by brief shoulder dystocia. Membrane Rupture Time/Date: 6:50 AM ,05/30/2021   Delivery Method:Vaginal, Spontaneous  Episiotomy: None  Lacerations:  None  Patient had an uncomplicated postpartum course.  She is ambulating, tolerating a regular diet, passing flatus, and urinating well. Patient is discharged home in stable condition on 06/01/21.  Newborn Data: Birth date:05/30/2021  Birth time:7:14 AM  Gender:Female  Living status:Living  Apgars:8 ,9  Weight:3640 g   Magnesium Sulfate received: No BMZ received: No Rhophylac:N/A MMR:N/A T-DaP:Given prenatally Flu:  N/A Transfusion:No  Physical exam  Vitals:   05/31/21 0438 05/31/21 1447 05/31/21 2103 06/01/21 0522  BP: 93/72 117/71 117/73 108/65  Pulse: 84 79 82 73  Resp: 16 17 16 18   Temp: 97.7 F (36.5 C)  98.2 F (36.8 C) 98.4 F (36.9 C)  TempSrc: Oral  Oral Oral  SpO2:   99%   Weight:      Height:       General: alert, cooperative, and no distress Lochia: appropriate Uterine Fundus: firm Incision: N/A DVT Evaluation: No evidence of DVT seen on physical exam. Labs: Lab Results  Component Value Date   WBC 8.5 05/30/2021   HGB 12.8 05/30/2021   HCT 37.4 05/30/2021   MCV 85.6 05/30/2021   PLT 270 05/30/2021   CMP Latest Ref Rng & Units 02/14/2016  Glucose 65 - 99 mg/dL 94  BUN 6 - 20 mg/dL 7  Creatinine 0.44 - 1.00 mg/dL 0.58  Sodium 135 - 145 mmol/L 141  Potassium 3.5 - 5.1 mmol/L 4.0  Chloride 101 - 111 mmol/L 109  CO2 22 - 32 mmol/L 23  Calcium 8.9 - 10.3 mg/dL 9.3   Edinburgh Score: Edinburgh Postnatal Depression Scale Screening Tool 05/31/2021  I have been able to laugh and see the funny side of things. 0  I have looked forward with enjoyment to things. 0  I have blamed myself unnecessarily when things went wrong. 1  I have been anxious or worried for no good reason. 1  I have felt scared or panicky for no good reason. 0  Things have been getting on top  of me. 0  I have been so unhappy that I have had difficulty sleeping. 0  I have felt sad or miserable. 0  I have been so unhappy that I have been crying. 1  The thought of harming myself has occurred to me. 0  Edinburgh Postnatal Depression Scale Total 3     After visit meds:  Allergies as of 06/01/2021       Reactions   Latex Rash        Medication List     STOP taking these medications    valACYclovir 1000 MG tablet Commonly known as: VALTREX       TAKE these medications    acetaminophen 325 MG tablet Commonly known as: Tylenol Take 2 tablets (650 mg total) by mouth every 6 (six) hours.    Blood Pressure Kit Devi 1 kit by Does not apply route once a week. Check Blood Pressure regularly and record readings into the Babyscripts App.  Large Cuff.  DX O90.0   ibuprofen 600 MG tablet Commonly known as: ADVIL Take 1 tablet (600 mg total) by mouth every 6 (six) hours.   norethindrone 0.35 MG tablet Commonly known as: Ortho Micronor Take 1 tablet (0.35 mg total) by mouth daily.   Prenatal Vitamin 27-0.8 MG Tabs Take 1 tablet by mouth daily.   senna-docusate 8.6-50 MG tablet Commonly known as: Senokot-S Take 2 tablets by mouth daily.         Discharge home in stable condition Infant Feeding: Breast Infant Disposition:home with mother Discharge instruction: per After Visit Summary and Postpartum booklet. Activity: Advance as tolerated. Pelvic rest for 6 weeks.  Diet: routine diet Future Appointments:No future appointments. Follow up Visit: Message sent to [facility] by [facility].  Please schedule this patient for a In person postpartum visit in 6 weeks with the following provider: Any provider. Additional Postpartum F/U: none   Low risk pregnancy complicated by:  H/o HSV (on valtrex in pregnancy) Delivery mode:  Vaginal, Spontaneous  Anticipated Birth Control:  POPs   06/01/2021 Janet Berlin, MD

## 2021-05-31 NOTE — Progress Notes (Signed)
POSTPARTUM PROGRESS NOTE  Subjective: Kristin Stokes is a 25 y.o. [id number] s/p SVD at [redacted]w[redacted]d.  She reports she doing well. No acute events overnight. She denies any problems with ambulating, voiding or po intake. Denies nausea or vomiting. She has passed flatus. Pain is well controlled.  Lochia is mild.  Objective: Blood pressure 93/72, pulse 84, temperature 97.7 F (36.5 C), temperature source Oral, resp. rate 16, height 5\' 9"  (1.753 m), weight 90.7 kg, last menstrual period 08/14/2020, unknown if currently breastfeeding.  Physical Exam:  General: alert, cooperative and no distress Chest: no respiratory distress Abdomen: soft, appropriately-tender  Uterine Fundus: firm and at level of umbilicus Extremities: No calf swelling or tenderness  no edema  Recent Labs    05/30/21 0419  HGB 12.8  HCT 37.4    Assessment/Plan: Kristin Stokes is a 25 y.o. G3P3003 s/p SVD at [redacted]w[redacted]d. Did not received adequate gbs prophylaxis, patient aware of peds recommendation to stay for 48 hours..  Routine Postpartum Care: Doing well, pain well-controlled.  -- Continue routine care, lactation support  -- Contraception: POPs -- Feeding: Breast    Dispo: Plan for discharge PPD#2.  [redacted]w[redacted]d, MD OB Fellow, [facility] 05/31/2021 6:48 AM

## 2021-05-31 NOTE — Lactation Note (Signed)
This note was copied from a baby's chart. Mom declined lactation support.

## 2021-06-01 ENCOUNTER — Encounter: Payer: BC Managed Care – PPO | Admitting: Obstetrics and Gynecology

## 2021-06-01 MED ORDER — IBUPROFEN 600 MG PO TABS: 600.0000 mg | ORAL_TABLET | Freq: Four times a day (QID) | ORAL | 0 refills | Status: DC

## 2021-06-01 MED ORDER — ACETAMINOPHEN 325 MG PO TABS: 650.0000 mg | ORAL_TABLET | Freq: Four times a day (QID) | ORAL | Status: DC

## 2021-06-01 MED ORDER — NORETHINDRONE 0.35 MG PO TABS: 1.0000 | ORAL_TABLET | Freq: Every day | ORAL | 11 refills | Status: DC

## 2021-06-01 MED ORDER — SENNOSIDES-DOCUSATE SODIUM 8.6-50 MG PO TABS: 2.0000 | ORAL_TABLET | Freq: Every day | ORAL | Status: DC

## 2021-06-09 ENCOUNTER — Telehealth (HOSPITAL_COMMUNITY): Payer: Self-pay | Admitting: *Deleted

## 2021-06-09 NOTE — Telephone Encounter (Signed)
Mom reports feeling well. No concerns about herself. EPDS = 3 (hospital score = 3) Mom reports baby is doing well. Gaining weight. Breastfeeding without difficulty. No concerns about baby. Duffy Rhody, RN 06/09/2021 at 4:00pm

## 2021-07-12 ENCOUNTER — Ambulatory Visit (INDEPENDENT_AMBULATORY_CARE_PROVIDER_SITE_OTHER): Payer: BC Managed Care – PPO | Admitting: Advanced Practice Midwife

## 2021-07-12 ENCOUNTER — Other Ambulatory Visit: Payer: Self-pay

## 2021-07-12 NOTE — Progress Notes (Signed)
Post Partum Visit Note  Kristin Stokes is a 25 y.o. [id number] female who presents for a postpartum visit. She is 6 weeks postpartum following a normal spontaneous vaginal delivery.  I have fully reviewed the prenatal and intrapartum course. The delivery was at 40 gestational weeks.  Anesthesia: IV sedation. Postpartum course has been uncomplicated. Baby is doing well. Baby is feeding by breast. Bleeding moderate lochia. Bowel function is normal. Bladder function is normal. Patient is not sexually active. Contraception method is oral progesterone-only contraceptive.   Postpartum depression screening: negative, score 0.   The pregnancy intention screening data noted above was reviewed. Potential methods of contraception were discussed. The patient elected to proceed with No data recorded.   Edinburgh Postnatal Depression Scale - 07/12/21 1031       Edinburgh Postnatal Depression Scale:  In the Past 7 Days   I have been able to laugh and see the funny side of things. 0    I have looked forward with enjoyment to things. 0    I have blamed myself unnecessarily when things went wrong. 0    I have been anxious or worried for no good reason. 0    I have felt scared or panicky for no good reason. 0    Things have been getting on top of me. 0    I have been so unhappy that I have had difficulty sleeping. 0    I have felt sad or miserable. 0    I have been so unhappy that I have been crying. 0    The thought of harming myself has occurred to me. 0    Edinburgh Postnatal Depression Scale Total 0             Health Maintenance Due  Topic Date Due   INFLUENZA VACCINE  06/28/2021    The following portions of the patient's history were reviewed and updated as appropriate: allergies, current medications, past family history, past medical history, past social history, past surgical history, and problem list.  Review of Systems Pertinent items noted in HPI and remainder of comprehensive ROS  otherwise negative.  Objective:  BP (!) 135/99   Pulse (!) 112   Wt 191 lb (86.6 kg)   LMP 07/10/2021 (Exact Date)   BMI 28.21 kg/m   VS reviewed, nursing note reviewed,  Constitutional: well developed, well nourished, no distress HEENT: normocephalic CV: normal rate Pulm/chest wall: normal effort Abdomen: soft Neuro: alert and oriented x 3 Skin: warm, dry Psych: affect normal   Assessment:    1. Postpartum care following vaginal delivery --Doing well, bonding well with infant, good support at home. --Menses 3 days ago, painful and heavy, light and spotting only today. --Started POPs 1 day ago, next menses should be lighter but may be irregular. Pt with hx irregular menses without contraception.   Plan:   Essential components of care per ACOG recommendations:  1.  Mood and well being: Patient with negative depression screening today. Reviewed local resources for support.  - Patient tobacco use? No.   - hx of drug use? No.    2. Infant care and feeding:  -Patient currently breastmilk feeding? Yes. Discussed returning to work and pumping. Reviewed importance of draining breast regularly to support lactation.  -Social determinants of health (SDOH) reviewed in EPIC. No concerns  3. Sexuality, contraception and birth spacing - Patient does not want a pregnancy in the next year.   - Reviewed forms of contraception in tiered fashion.  Patient desired oral progesterone-only contraceptive today.   - Discussed birth spacing of 18 months  4. Sleep and fatigue -Encouraged family/partner/community support of 4 hrs of uninterrupted sleep to help with mood and fatigue  5. Physical Recovery  - Discussed patients delivery and complications. She describes her labor as good. - Patient had a Vaginal, no problems at delivery. Patient had no laceration. Perineal healing reviewed. Patient expressed understanding - Patient has urinary incontinence? No. - Patient is safe to resume physical  and sexual activity  6.  Health Maintenance - HM due items addressed Yes - Last pap smear  Diagnosis  Date Value Ref Range Status  11/11/2019      - Negative for intraepithelial lesion or malignancy (NILM)   Pap smear not done at today's visit.  -Breast Cancer screening indicated? No.   7. Chronic Disease/Pregnancy Condition follow up:  HTN for first time today, 6 weeks PP, mild range.  Consult Dr Crissie Reese. Pt to see PCP in 1-2 months for BP check.  - PCP follow up  Sharen Counter, CNM [facility] [employer], [facility]

## 2021-10-25 ENCOUNTER — Emergency Department (HOSPITAL_COMMUNITY): Payer: BC Managed Care – PPO

## 2021-10-25 ENCOUNTER — Encounter (HOSPITAL_COMMUNITY): Payer: Self-pay | Admitting: Pharmacy Technician

## 2021-10-25 ENCOUNTER — Emergency Department (HOSPITAL_COMMUNITY)
Admission: EM | Admit: 2021-10-25 | Discharge: 2021-10-26 | Disposition: A | Discharge status: Home / Self Care | Payer: BC Managed Care – PPO | Attending: Student | Admitting: Student

## 2021-10-25 ENCOUNTER — Other Ambulatory Visit: Payer: Self-pay

## 2021-10-25 DIAGNOSIS — X509XXA Other and unspecified overexertion or strenuous movements or postures, initial encounter: Secondary | ICD-10-CM | POA: Diagnosis not present

## 2021-10-25 DIAGNOSIS — Y990 Civilian activity done for income or pay: Secondary | ICD-10-CM | POA: Insufficient documentation

## 2021-10-25 DIAGNOSIS — Z5321 Procedure and treatment not carried out due to patient leaving prior to being seen by health care provider: Secondary | ICD-10-CM | POA: Insufficient documentation

## 2021-10-25 DIAGNOSIS — M545 Low back pain, unspecified: Secondary | ICD-10-CM | POA: Insufficient documentation

## 2021-10-25 LAB — PREGNANCY, URINE: Preg Test, Ur: NEGATIVE

## 2021-10-25 MED ORDER — KETOROLAC TROMETHAMINE 30 MG/ML IJ SOLN: 30.0000 mg | Freq: Once | INTRAMUSCULAR | Status: DC

## 2021-10-25 NOTE — ED Triage Notes (Signed)
Pt here with lumbar back pain onset today. Pt reports she was sitting down and when she went to stand up the severe pain started. Denies injury.

## 2021-10-25 NOTE — ED Provider Notes (Signed)
Emergency Medicine Provider Triage Evaluation Note  Kristin Stokes , a 25 y.o. female  was evaluated in triage.  Pt complains of lumbar pain.  Started acutely today when she was getting from a sitting to standing position at work.  Pain is constant, has tried Tylenol without any relief.  History of physical therapy 12 strength and muscle surrounding the back, no prior spinal procedures.    Review of Systems  Positive: Back pain Negative: No history of malignancy, no IV drug use, no urinary incontinence or retention, no bilateral leg numbness, no saddle anesthesia.  Physical Exam  BP 134/86 (BP Location: Right Arm)   Pulse 100   Temp 99.6 F (37.6 C) (Oral)   Resp 20   SpO2 100%  Gen:   Awake, no distress   Resp:  Normal effort  MSK:   Lumbar tenderness, right paraspinal tenderness Other:  Cranial nerves III through XII are grossly intact  Medical Decision Making  Medically screening exam initiated at 4:22 PM.  Appropriate orders placed.  Kristin Stokes was informed that the remainder of the evaluation will be completed by another provider, this initial triage assessment does not replace that evaluation, and the importance of remaining in the ED until their evaluation is complete.  Kristin Pert, PA-C 10/25/21 1623    Lorre Nick, MD 10/26/21 [phone]

## 2021-10-26 NOTE — ED Notes (Signed)
Patient called for vitals recheckwith no response and not visible in lobby. Patients wheelchair empty with patient labels still in there

## 2021-11-02 ENCOUNTER — Emergency Department (HOSPITAL_BASED_OUTPATIENT_CLINIC_OR_DEPARTMENT_OTHER)
Admission: EM | Admit: 2021-11-02 | Discharge: 2021-11-02 | Disposition: A | Discharge status: Home / Self Care | Payer: BC Managed Care – PPO | Attending: Emergency Medicine | Admitting: Emergency Medicine

## 2021-11-02 ENCOUNTER — Other Ambulatory Visit: Payer: Self-pay

## 2021-11-02 ENCOUNTER — Encounter (HOSPITAL_BASED_OUTPATIENT_CLINIC_OR_DEPARTMENT_OTHER): Payer: Self-pay | Admitting: Emergency Medicine

## 2021-11-02 DIAGNOSIS — M545 Low back pain, unspecified: Secondary | ICD-10-CM | POA: Insufficient documentation

## 2021-11-02 DIAGNOSIS — Z91040 Latex allergy status: Secondary | ICD-10-CM | POA: Diagnosis not present

## 2021-11-02 DIAGNOSIS — Z8616 Personal history of COVID-19: Secondary | ICD-10-CM | POA: Insufficient documentation

## 2021-11-02 MED ORDER — CYCLOBENZAPRINE HCL 10 MG PO TABS: 10.0000 mg | ORAL_TABLET | Freq: Three times a day (TID) | ORAL | 0 refills | Status: AC

## 2021-11-02 MED ORDER — PREDNISONE 20 MG PO TABS: 40.0000 mg | ORAL_TABLET | Freq: Every day | ORAL | 0 refills | Status: AC

## 2021-11-02 NOTE — ED Provider Notes (Signed)
[facility] EMERGENCY DEPT Provider Note   CSN: [id number] Arrival date & time: 11/02/21  1100     History Chief Complaint  Patient presents with   Back Pain    Kristin Stokes is a 25 y.o. female.  25 y.o female with a PMH of Anemia, Genital herpes presents to the ED with a chief complaint of lower back pain x 3 weeks. Patient is currently employed as a Pharmacist, hospital, report symptoms began prior to thanksgiving, describes this as a "crunchy feeling" to the midline. Exacerbated with sitting to standing. She also had prior physical therapy for her back problems. She also describes the pain radiating into the right hip.  She does currently have a 61-monthold that she picks up, feels that this does not exacerbate the pain.  She does not have any prior history of diabetes.  No prior history of cancer, no prior history of IV drug use, fever, bowel or bladder complaints.  The history is provided by the patient.  Back Pain Associated symptoms: no abdominal pain, no chest pain and no fever       Past Medical History:  Diagnosis Date   Anemia    COVID-19 11/17/2020   Genital herpes     Patient Active Problem List   Diagnosis Date Noted   Shoulder dystocia, delivered 05/30/2021   History of ELISA positive for HSV 05/10/2021    Past Surgical History:  Procedure Laterality Date   ANKLE SURGERY Right      OB History     Gravida  3   Para  3   Term  3   Preterm      AB      Living  3      SAB      IAB      Ectopic      Multiple  0   Live Births  3           Family History  Problem Relation Age of Onset   Asthma Mother    Anxiety disorder Father    Hypertension Other    Diabetes Other    Cancer Other    COPD Maternal Grandmother    Asthma Maternal Grandmother    Hypertension Maternal Grandmother     Social History   Tobacco Use   Smoking status: Never   Smokeless tobacco: Never  Vaping Use   Vaping Use: Never used  Substance Use  Topics   Alcohol use: Not Currently    Comment: Occasionally   Drug use: Not Currently    Comment: not since confirmed pregnancy    Home Medications Prior to Admission medications   Medication Sig Start Date End Date Taking? Authorizing Provider  acetaminophen (TYLENOL) 325 MG tablet Take 2 tablets (650 mg total) by mouth every 6 (six) hours. 06/01/21  Yes Marsala, JPlacido Sou MD  cyclobenzaprine (FLEXERIL) 10 MG tablet Take 1 tablet (10 mg total) by mouth 3 (three) times daily for 7 days. 11/02/21 11/09/21 Yes Wilhelmenia Addis, JBeverley Fiedler PA-C  ibuprofen (ADVIL) 600 MG tablet Take 1 tablet (600 mg total) by mouth every 6 (six) hours. 06/01/21  Yes MJanet Berlin MD  naproxen (NAPROSYN) 500 MG tablet Take 500 mg by mouth 2 (two) times daily with a meal.   Yes [provider]  norethindrone (ORTHO MICRONOR) 0.35 MG tablet Take 1 tablet (0.35 mg total) by mouth daily. 06/01/21 06/01/22 Yes Marsala, JPlacido Sou MD  predniSONE (DELTASONE) 20 MG tablet Take 2 tablets (40 mg total)  by mouth daily for 5 days. 11/02/21 11/07/21 Yes Remi Rester, Beverley Fiedler, PA-C  Prenatal Vit-Fe Fumarate-FA (PRENATAL VITAMIN) 27-0.8 MG TABS Take 1 tablet by mouth daily. 01/11/19  Yes Burleson, Terri L, NP  Blood Pressure Monitoring (BLOOD PRESSURE KIT) DEVI 1 kit by Does not apply route once a week. Check Blood Pressure regularly and record readings into the Babys[employer]s App.  Large Cuff.  DX O90.0 10/06/20   Shelly Bombard, MD    Allergies    Latex  Review of Systems   Review of Systems  Constitutional:  Negative for fever.  HENT:  Negative for sore throat.   Respiratory:  Negative for shortness of breath.   Cardiovascular:  Negative for chest pain.  Gastrointestinal:  Negative for abdominal pain, nausea and vomiting.  Genitourinary:  Negative for difficulty urinating and flank pain.  Musculoskeletal:  Positive for back pain. Negative for myalgias.   Physical Exam Updated Vital Signs BP 134/77 (BP Location: Right Arm)   Pulse 98    Temp 98.3 F (36.8 C)   Resp 16   Ht 5' 9"  (1.753 m)   Wt 88.5 kg   SpO2 99%   BMI 28.80 kg/m   Physical Exam Vitals and nursing note reviewed.  Constitutional:      General: She is not in acute distress.    Appearance: She is well-developed.  HENT:     Head: Normocephalic and atraumatic.  Eyes:     Conjunctiva/sclera: Conjunctivae normal.  Cardiovascular:     Rate and Rhythm: Normal rate and regular rhythm.     Heart sounds: No murmur heard. Pulmonary:     Effort: Pulmonary effort is normal. No respiratory distress.     Breath sounds: Normal breath sounds.  Abdominal:     Palpations: Abdomen is soft.     Tenderness: There is no abdominal tenderness.  Musculoskeletal:        General: No swelling.     Cervical back: Neck supple.     Comments: RLE- KF,KE 5/5 strength LLE- HF, HE 5/5 strength Normal gait. No pronator drift. No leg drop.  CN I, II and VIII not tested. CN II-XII grossly intact bilaterally.      Skin:    General: Skin is warm and dry.     Capillary Refill: Capillary refill takes less than 2 seconds.  Neurological:     Mental Status: She is alert.  Psychiatric:        Mood and Affect: Mood normal.    ED Results / Procedures / Treatments   Labs (all labs ordered are listed, but only abnormal results are displayed) Labs Reviewed - No data to display  EKG None  Radiology No results found.  Procedures Procedures   Medications Ordered in ED Medications - No data to display  ED Course  I have reviewed the triage vital signs and the nursing notes.  Pertinent labs & imaging results that were available during my care of the patient were reviewed by me and considered in my medical decision making (see chart for details).    MDM Rules/Calculators/A&P   Patient presents to the ED chief complaint of low back pain that is been ongoing for the past 3 weeks.  Previously evaluated at [facility], did not stay after MSE.  Reports the pain has been  ongoing, has some relief with some prior muscle relaxers.  Prior history physical therapy due to back pain.  No red flags such as bowel or bladder incontinence, fever, IV drug use,  cancer.  Derm evaluation there is pain not reproducible to palpation along the midline of her back, prior x-ray did not show any acute findings.  She is ambulatory in the ED with steady gait.  Her vitals are within normal limits.  Some suspicion for sciatica, does have worsening symptoms with sitting to standing however is able to ambulate.  We did discuss follow-up with PCP, likely will need to revisit physical therapy.  We discussed short course of steroids, denies any prior history of diabetes.  Along with muscle relaxers to help with symptomatic control.  She is agreeable to plan and management, patient stable for discharge.   Portions of this note were generated with Lobbyist. Dictation errors may occur despite best attempts at proofreading.  Final Clinical Impression(s) / ED Diagnoses Final diagnoses:  Acute right-sided low back pain without sciatica    Rx / DC Orders ED Discharge Orders          Ordered    cyclobenzaprine (FLEXERIL) 10 MG tablet  3 times daily        11/02/21 1437    predniSONE (DELTASONE) 20 MG tablet  Daily        11/02/21 1437             Janeece Fitting, PA-C 11/02/21 Kirwin, Stafford A, DO 11/03/21 1645

## 2021-11-02 NOTE — Discharge Instructions (Addendum)
I have prescribed muscle relaxers for your pain, please do not drink alcohol or drive while taking this medications as it can make you drowsy.   I have also prescribed steroids, be aware this medication can cause insomnia, appetite changes.    Please follow-up with PCP in 1 week for reevaluation of your symptoms.If you experience any bowel or bladder incontinence, fever, worsening in your symptoms please return to the ED.

## 2021-11-02 NOTE — ED Triage Notes (Signed)
Back pain  x 3 weeks , hurts into hip rt  hip , drove herself today , no injury she states

## 2021-11-17 ENCOUNTER — Ambulatory Visit: Payer: Medicaid Other

## 2021-11-18 ENCOUNTER — Ambulatory Visit: Payer: BC Managed Care – PPO | Attending: Internal Medicine

## 2021-11-18 ENCOUNTER — Other Ambulatory Visit: Payer: Self-pay

## 2021-11-18 DIAGNOSIS — M545 Low back pain, unspecified: Secondary | ICD-10-CM | POA: Insufficient documentation

## 2021-11-18 DIAGNOSIS — M6281 Muscle weakness (generalized): Secondary | ICD-10-CM | POA: Diagnosis present

## 2021-11-18 DIAGNOSIS — G8929 Other chronic pain: Secondary | ICD-10-CM | POA: Insufficient documentation

## 2021-11-18 NOTE — Therapy (Signed)
[facility] Out[facility] @ [facility] [address] [place], [place], [address] Phone: [phone]   Fax:  [phone]  Physical Therapy Evaluation  Patient Details  Name: Kristin Stokes MRN: [id number] Date of Birth: 1996-11-14 Referring Provider (PT): Harvest Forest, MD   Encounter Date: 11/18/2021   PT End of Session - 11/18/21 1028     Visit Number 1    Date for PT Re-Evaluation 12/24/21    Authorization Type [employer]    Authorization - Visit Number 1    Progress Note Due on Visit 0    PT Start Time 0815    PT Stop Time 0845    PT Time Calculation (min) 30 min    Activity Tolerance Patient tolerated treatment well    Behavior During Therapy [facility] for tasks assessed/performed             Past Medical History:  Diagnosis Date   Anemia    COVID-19 11/17/2020   Genital herpes     Past Surgical History:  Procedure Laterality Date   ANKLE SURGERY Right     There were no vitals filed for this visit.    Subjective Assessment - 11/18/21 0820     Subjective Patient arrives 10 min late.  She has had 3 pregnancies and had low back pain with her 2nd child and had to do some therapy then but symptoms subsided.  She did not have any pain during the 3rd pregnancy which was this most recent in July.  She explains that she was doing well and then on July 23rd she had a severe bout of back pain that landed her in the ED.  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  She works full time and then visits with her Mom and kids like to play outdoors. She hopes to re-exacerbation of symptoms and learn how to manage her back pain.    Limitations Sitting;Standing    How long can you sit comfortably? 30 min    How long can you stand comfortably? 15 min    How long can you walk comfortably? unlimited    Diagnostic tests xrays unremarkable    Patient Stated Goals To avoid symptoms  recurring.    Currently in Pain? No/denies    Pain Score 0-No pain   Currently no pain   Pain Orientation Right;Medial    Pain Descriptors / Indicators Aching                [facility] PT Assessment - 11/18/21 0001       Assessment   Medical Diagnosis Low back pain    Referring Provider (PT) Harvest Forest, MD    Onset Date/Surgical Date 06/21/21    Hand Dominance Right    Next MD Visit as needed    Prior Therapy yes      Precautions   Precautions None      Balance Screen   Has the patient fallen in the past 6 months No      Home Environment   Living Environment Private residence    Living Arrangements Spouse/significant other;Children    Type of Home House    Home Access Stairs to enter    Entrance Stairs-Number of Steps 3    Entrance Stairs-Rails Left    Home Layout One level    Home Equipment None      Prior Function  Level of Independence Independent    Vocation Full time employment    Comptroller; standing/ sitting/      Cognition   Overall Cognitive Status Within Functional Limits for tasks assessed      Observation/Other Assessments   Focus on Therapeutic Outcomes (FOTO)  94      Sensation   Light Touch Appears Intact      Functional Tests   Functional tests Sit to Stand      Sit to Stand   Comments Able to come to stand and sit with ease without use of UE's      Posture/Postural Control   Posture/Postural Control Postural limitations    Postural Limitations Increased lumbar lordosis;Anterior pelvic tilt      ROM / Strength   AROM / PROM / Strength AROM;Strength      AROM   AROM Assessment Site Lumbar    Lumbar Flexion fingertips to toes    Lumbar Extension WNL    Lumbar - Right Side Bend WNL    Lumbar - Left Side Bend WNL    Lumbar - Right Rotation WNL    Lumbar - Left Rotation WNL      Strength   Overall Strength Within functional limits for tasks performed      Flexibility   Soft Tissue Assessment /Muscle  Length yes    Hamstrings limited to approx 70 degrees bilaterally    Quadriceps positive thomas test bilaterally                        Objective measurements completed on examination: See above findings.                PT Education - 11/18/21 1026     Education Details Initiated ZOX:WRUEAV Code: [email or web address] and educated on importance of hamstring flexibility as her lumbar spine is hypermobile in flexion which places her discs at risk for injury.    Person(s) Educated Patient    Methods Explanation;Demonstration;Verbal cues;Handout    Comprehension Verbalized understanding;Returned demonstration;Verbal cues required              PT Short Term Goals - 11/18/21 1045       PT SHORT TERM GOAL #1   Title Independence with initial HEP    Time 2    Period Weeks    Status New    Target Date 12/10/21               PT Long Term Goals - 11/18/21 1046       PT LONG TERM GOAL #1   Title Independence with advanced HEP    Time 4    Period Weeks    Status New    Target Date 12/24/21      PT LONG TERM GOAL #2   Title Patient to be able to demonstrate proper body mechanics with lifting and bending fwd    Time 4    Period Weeks    Status New      PT LONG TERM GOAL #3   Title Patient to have no recurrance of low back pain through episode.    Time 4    Period Weeks    Status New    Target Date 12/24/21      PT LONG TERM GOAL #4   Title Patient to return understanding of managing her low back pain.    Time 4    Period Weeks    Status New  Target Date 12/24/21                    Plan - 11/18/21 1037     Clinical Impression Statement Kristin Stokes is a 25 y.o. female who is 6 months post partum.  She has experienced several bouts of low back pain, recently severe enough to land her in the E.D.  She seems to have responded very well to prednisone dose pack.  She has no pain at this time but is concerned that the pain may return.  She  presents with moderate hamstring and hip flexor tightness but good lumbar ROM and bilateral LE strength.  She would benefit from a few visits of skilled PT for a comprehensive stretching and core stabilization program.  Her goal is to eliminate further episodes of low back pain by being more knowledgeable about caring for her back.    Personal Factors and Comorbidities Comorbidity 1    Examination-Activity Limitations Stand;Sit    Examination-Participation Restrictions Occupation    Stability/Clinical Decision Making Stable/Uncomplicated    Clinical Decision Making Low    Rehab Potential Excellent    PT Frequency 1x / week    PT Duration 4 weeks    PT Treatment/Interventions ADLs/Self Care Home Management;Aquatic Therapy;Traction;Moist Heat;Iontophoresis 4mg /ml Dexamethasone;Electrical Stimulation;Cryotherapy;Ultrasound;Therapeutic exercise;Therapeutic activities;Functional mobility training;Stair training;Neuromuscular re-education;Patient/family education;Manual techniques;Passive range of motion;Taping;Dry needling;Spinal Manipulations;Joint Manipulations    PT Next Visit Plan Teach proper body mechanics, progress core stabilization program (possible DC if no recurrance of symptoms)    PT Home Exercise Plan Access Code: [email or web address]    Consulted and Agree with Plan of Care Patient             Patient will benefit from skilled therapeutic intervention in order to improve the following deficits and impairments:  Increased muscle spasms, Pain, Postural dysfunction, Impaired flexibility  Visit Diagnosis: Chronic bilateral low back pain without sciatica - Plan: PT plan of care cert/re-cert  Muscle weakness (generalized) - Plan: PT plan of care cert/re-cert     Problem List Patient Active Problem List   Diagnosis Date Noted   Shoulder dystocia, delivered 05/30/2021   History of ELISA positive for HSV 05/10/2021    Glade Strausser B. Sierra Spargo, PT 11/18/2209:51 AM   [facility]  Outpatient & Specialty Rehab @ [facility] [address] [place], [place], [place] Phone: [phone]   Fax:  [phone]  Name: Kristin Stokes MRN: Rudean Haskell Date of Birth: [birth year]

## 2021-11-18 NOTE — Patient Instructions (Signed)
Access Code: [email or web address] URL: [email or web address] Date: 11/18/2021 Prepared by: Mikey Kirschner  Exercises Supine Posterior Pelvic Tilt - 2 x daily - 7 x weekly - 1 sets - 20 reps Supine Dead Bug with Leg Extension - 2 x daily - 7 x weekly - 1 sets - 20 reps Standing Hamstring Stretch on Chair - 2 x daily - 7 x weekly - 1 sets - 3 reps - 30 sec hold Standing Quad Stretch with Table and Chair Support - 2 x daily - 7 x weekly - 1 sets - 3 reps - 30 sec hold

## 2021-12-02 ENCOUNTER — Other Ambulatory Visit: Payer: Self-pay

## 2021-12-02 ENCOUNTER — Ambulatory Visit: Payer: BC Managed Care – PPO | Attending: Internal Medicine | Admitting: Physical Therapy

## 2021-12-02 DIAGNOSIS — M545 Low back pain, unspecified: Secondary | ICD-10-CM | POA: Diagnosis not present

## 2021-12-02 DIAGNOSIS — M6281 Muscle weakness (generalized): Secondary | ICD-10-CM | POA: Diagnosis present

## 2021-12-02 DIAGNOSIS — G8929 Other chronic pain: Secondary | ICD-10-CM | POA: Insufficient documentation

## 2021-12-02 NOTE — Therapy (Signed)
[facility] [address] [place], [place], [address] Phone: [phone]   Fax:  [phone]  Physical Therapy Treatment  Patient Details  Name: Kristin Stokes MRN: [id number] Date of Birth: July 21, 1996 Referring Provider (PT): Har[place], MD   Encounter Date: 12/02/2021   PT End of Session - 12/02/21 1728     Visit Number 2    Date for PT Re-Evaluation 12/24/21    Authorization Type [employer] check for authorization    Authorization - Visit Number 2    PT Start Time 1530    PT Stop Time 1612    PT Time Calculation (min) 42 min    Activity Tolerance Patient tolerated treatment well             Past Medical History:  Diagnosis Date   Anemia    COVID-19 11/17/2020   Genital herpes     Past Surgical History:  Procedure Laterality Date   ANKLE SURGERY Right     There were no vitals filed for this visit.   Subjective Assessment - 12/02/21 1535     Subjective No LBP recently .  No issues riding to and from [place].    How long can you sit comfortably? 30 min    How long can you stand comfortably? 15 min    Currently in Pain? No/denies    Pain Score 0-No pain                               [facility] Adult PT Treatment/Exercise - 12/02/21 0001       Lumbar Exercises: Standing   Row Limitations 5# snatch and press overhead 10x    Other Standing Lumbar Exercises hip hinge with golf club 15x    Other Standing Lumbar Exercises modified dead lift with 2 5# weights 15x      Lumbar Exercises: Sidelying   Hip Abduction 15 reps;Right;Left    Other Sidelying Lumbar Exercises side planks 5 sec hold 5x each side      Lumbar Exercises: Prone   Other Prone Lumbar Exercises plank on knees 10x 5 sec hold      Lumbar Exercises: Quadruped   Other Quadruped Lumbar Exercises hover 5 sec 5x                     PT Education - 12/02/21 1608     Education Details  sidelying hip abduction; side planks, planks on knees; bird dogs, dead lifts with 2 5# weights    Person(s) Educated Patient    Methods Explanation;Demonstration;Handout    Comprehension Verbalized understanding;Returned demonstration              PT Short Term Goals - 11/18/21 1045       PT SHORT TERM GOAL #1   Title Independence with initial HEP    Time 2    Period Weeks    Status New    Target Date 12/10/21               PT Long Term Goals - 11/18/21 1046       PT LONG TERM GOAL #1   Title Independence with advanced HEP    Time 4    Period Weeks    Status New    Target Date 12/24/21      PT LONG TERM GOAL #2   Title Patient to be able to  demonstrate proper body mechanics with lifting and bending fwd    Time 4    Period Weeks    Status New      PT LONG TERM GOAL #3   Title Patient to have no recurrance of low back pain through episode.    Time 4    Period Weeks    Status New    Target Date 12/24/21      PT LONG TERM GOAL #4   Title Patient to return understanding of managing her low back pain.    Time 4    Period Weeks    Status New    Target Date 12/24/21                   Plan - 12/02/21 1729     Clinical Impression Statement The patient reports her back has been doing well even with traveling to [place].   Able to progress lumbo/pelvic/hip core strengthening exercises in a variety of positions.  Instructed in hip hinge method for lifting and dead lifting.  No pain produced during treatment session.    Stability/Clinical Decision Making Stable/Uncomplicated    Rehab Potential Excellent    PT Frequency 1x / week    PT Duration 4 weeks    PT Treatment/Interventions ADLs/Self Care Home Management;Aquatic Therapy;Traction;Moist Heat;Iontophoresis 4mg /ml Dexamethasone;Electrical Stimulation;Cryotherapy;Ultrasound;Therapeutic exercise;Therapeutic activities;Functional mobility training;Stair training;Neuromuscular re-education;Patient/family  education;Manual techniques;Passive range of motion;Taping;Dry needling;Spinal Manipulations;Joint Manipulations    PT Next Visit Plan review HEP; dead lifts/hip hinge review; try Pallof; lat bar; row    PT Home Exercise Plan Access Code: [email or web address]             Patient will benefit from skilled therapeutic intervention in order to improve the following deficits and impairments:  Increased muscle spasms, Pain, Postural dysfunction, Impaired flexibility  Visit Diagnosis: Chronic bilateral low back pain without sciatica  Muscle weakness (generalized)     Problem List Patient Active Problem List   Diagnosis Date Noted   Shoulder dystocia, delivered 05/30/2021   History of ELISA positive for HSV 05/10/2021   05/12/2021, PT 12/02/21 5:39 PM Phone: [phone] Fax: [phone]  [phone], PT 12/02/2021, 5:38 PM  [facility] [address] [place], [place], [place] Phone: [phone]   Fax:  [phone]  Name: Kristin Stokes MRN: Rudean Haskell Date of Birth: [birth year]

## 2021-12-02 NOTE — Patient Instructions (Signed)
Access Code: [email or web address] URL: [email or web address] Date: 12/02/2021 Prepared by: Lavinia Sharps  Exercises Supine Posterior Pelvic Tilt - 2 x daily - 7 x weekly - 1 sets - 20 reps Supine Dead Bug with Leg Extension - 2 x daily - 7 x weekly - 1 sets - 20 reps Standing Hamstring Stretch on Chair - 2 x daily - 7 x weekly - 1 sets - 3 reps - 30 sec hold Standing Quad Stretch with Table and Chair Support - 2 x daily - 7 x weekly - 1 sets - 3 reps - 30 sec hold Sidelying Hip Abduction - 1 x daily - 7 x weekly - 1 sets - 10 reps Side Plank on Knees - 1 x daily - 7 x weekly - 1 sets - 10 reps - 5 hold Bird Dog - 1 x daily - 7 x weekly - 1 sets - 10 reps - 5 hold Plank on Knees - 1 x daily - 7 x weekly - 1 sets - 10 reps - 5 hold Half Deadlift with Kettlebell - 1 x daily - 7 x weekly - 1 sets - 10 reps Kettlebell Deadlift - 1 x daily - 7 x weekly - 3 sets - 10 reps SNATCH AND PRESS OVERHEAD - 1 x daily - 7 x weekly - 1 sets - 10 reps

## 2021-12-09 ENCOUNTER — Telehealth: Payer: Self-pay | Admitting: Physical Therapy

## 2021-12-09 ENCOUNTER — Encounter: Payer: Medicaid Other | Admitting: Physical Therapy

## 2021-12-09 NOTE — Telephone Encounter (Signed)
Left message regarding no show appt.  Reminded of next appt on 1/19 at 3:30

## 2021-12-16 ENCOUNTER — Other Ambulatory Visit: Payer: Self-pay

## 2021-12-16 ENCOUNTER — Ambulatory Visit: Payer: BC Managed Care – PPO | Admitting: Physical Therapy

## 2021-12-16 DIAGNOSIS — M6281 Muscle weakness (generalized): Secondary | ICD-10-CM

## 2021-12-16 DIAGNOSIS — G8929 Other chronic pain: Secondary | ICD-10-CM

## 2021-12-16 DIAGNOSIS — M545 Low back pain, unspecified: Secondary | ICD-10-CM | POA: Diagnosis not present

## 2021-12-16 NOTE — Therapy (Signed)
[facility] °[address] °[place], [place], [address] °Phone: [phone]   Fax:  [phone] ° °Physical Therapy Treatment/Discharge Summary  ° °Patient Details  °Name: Kristin Stokes °MRN: [id number] °Date of Birth: 10/07/1996 °Referring Provider (PT): Bakare, Mobolaji B, MD ° ° °Encounter Date: 12/16/2021 ° ° PT End of Session - 12/16/21 1612   ° ° Visit Number 3   ° Date for PT Re-Evaluation 12/24/21   ° Authorization Type [employer] check for authorization   ° Authorization - Visit Number 3   ° PT Start Time 1533   ° PT Stop Time 1600   discharge visit  ° PT Time Calculation (min) 27 min   ° Activity Tolerance Patient tolerated treatment well   ° °  °  ° °  ° ° °Past Medical History:  °Diagnosis Date  ° Anemia   ° COVID-19 11/17/2020  ° Genital herpes   ° ° °Past Surgical History:  °Procedure Laterality Date  ° ANKLE SURGERY Right   ° ° °There were no vitals filed for this visit. ° ° Subjective Assessment - 12/16/21 1536   ° ° Subjective No LBP.  Doing side planks, dead lifts.  Able to lift kids without an issue.   ° Currently in Pain? No/denies   ° Pain Score 0-No pain   ° °  °  ° °  ° ° ° ° ° [facility] PT Assessment - 12/16/21 0001   ° °  ° Observation/Other Assessments  ° Focus on Therapeutic Outcomes (FOTO)  94   °  ° AROM  ° Lumbar Flexion fingertips to toes   ° Lumbar Extension WNL   ° Lumbar - Right Side Bend WNL   ° Lumbar - Left Side Bend WNL   ° Lumbar - Right Rotation WNL   ° Lumbar - Left Rotation WNL   °  ° Strength  ° Overall Strength Within functional limits for tasks performed   ° Overall Strength Comments Good symmetry with bird dogs, planks and grade 4/5 abdominals   ° °  °  ° °  ° ° ° ° ° ° ° ° ° ° ° ° ° ° ° ° [facility] Adult PT Treatment/Exercise - 12/16/21 0001   ° °  ° Lumbar Exercises: Aerobic  ° Nustep L5 6 min while discussing status/progress   °  ° Lumbar Exercises: Standing  ° Row Limitations 10# snatch and overhead press   °  Other Standing Lumbar Exercises discussed SLS practice for symmetry   ° Other Standing Lumbar Exercises dead lift with 20# 10x   °  ° Lumbar Exercises: Prone  ° Other Prone Lumbar Exercises full plank   °  ° Lumbar Exercises: Quadruped  ° Opposite Arm/Leg Raise Right arm/Left leg;Left arm/Right leg   ° °  °  ° °  ° ° ° ° ° ° ° ° ° ° ° ° PT Short Term Goals - 12/16/21 1555   ° °  ° PT SHORT TERM GOAL #1  ° Title Independence with initial HEP   ° Status Achieved   ° °  °  ° °  ° ° ° ° PT Long Term Goals - 12/16/21 1555   ° °  ° PT LONG TERM GOAL #1  ° Title Independence with advanced HEP   ° Status Achieved   °  ° PT LONG TERM GOAL #2  ° Title Patient to be able to demonstrate proper body mechanics   with lifting and bending fwd    Status Achieved      PT LONG TERM GOAL #3   Title Patient to have no recurrance of low back pain through episode.    Status Achieved      PT LONG TERM GOAL #4   Title Patient to return understanding of managing her low back pain.    Status Achieved                   Plan - 12/16/21 1613     Clinical Impression Statement The patient has been painfree the last 2 visits/2 weeks.  She has been compliant with HEP for lumbo/pelvic hip strengthening at an intermediate level.  She has full and painfree lumbar ROM and high FOTO score of 94%.  We reviewed dead lift technique/hip hinge and discussed a basic return to gym program.  She has met all goals and expresses readiness for discharge from PT at this time.             Patient will benefit from skilled therapeutic intervention in order to improve the following deficits and impairments:     Visit Diagnosis: Chronic bilateral low back pain without sciatica  Muscle weakness (generalized)   PHYSICAL THERAPY DISCHARGE SUMMARY  Visits from Start of Care: 3  Current functional level related to goals / functional outcomes: See clinical impressions above   Remaining deficits: None   Education /  Equipment: HEP   Patient agrees to discharge. Patient goals were met. Patient is being discharged due to meeting the stated rehab goals.   Problem List Patient Active Problem List   Diagnosis Date Noted   Shoulder dystocia, delivered 05/30/2021   History of ELISA positive for HSV 05/10/2021   Ruben Im, PT 12/16/21 4:18 PM Phone: [phone] Fax: [phone]  Alvera Singh, PT 12/16/2021, 4:17 PM  [facility] [place], [place], [address] Phone: [phone]   Fax:  [phone]  Name: Kristin Stokes MRN: [id number] Date of Birth: Oct 29, 1996

## 2021-12-30 ENCOUNTER — Encounter: Payer: Medicaid Other | Admitting: Physical Therapy

## 2022-01-06 ENCOUNTER — Encounter: Payer: Medicaid Other | Admitting: Physical Therapy

## 2022-05-04 ENCOUNTER — Other Ambulatory Visit: Payer: Self-pay

## 2022-05-04 DIAGNOSIS — Z3041 Encounter for surveillance of contraceptive pills: Secondary | ICD-10-CM

## 2022-05-04 MED ORDER — NORETHINDRONE 0.35 MG PO TABS: 1.0000 | ORAL_TABLET | Freq: Every day | ORAL | 0 refills | Status: DC

## 2022-05-06 ENCOUNTER — Other Ambulatory Visit: Payer: Self-pay | Admitting: *Deleted

## 2022-05-06 DIAGNOSIS — Z3041 Encounter for surveillance of contraceptive pills: Secondary | ICD-10-CM

## 2022-05-06 MED ORDER — NORETHINDRONE 0.35 MG PO TABS: 1.0000 | ORAL_TABLET | Freq: Every day | ORAL | 2 refills | Status: DC

## 2022-05-06 NOTE — Progress Notes (Signed)
Patient due for annual exam after 07/19/22. RX OCP sent to cover thru then.

## 2022-06-08 ENCOUNTER — Telehealth: Payer: Self-pay

## 2022-06-08 NOTE — Telephone Encounter (Signed)
Returned call about refilling BC until annual, pt has refills available. No answer, left vm to call.

## 2022-08-10 ENCOUNTER — Ambulatory Visit: Payer: BC Managed Care – PPO | Admitting: Student

## 2022-10-05 ENCOUNTER — Ambulatory Visit: Payer: BC Managed Care – PPO | Admitting: Student

## 2022-10-17 ENCOUNTER — Other Ambulatory Visit: Payer: Self-pay

## 2022-10-17 ENCOUNTER — Emergency Department (HOSPITAL_BASED_OUTPATIENT_CLINIC_OR_DEPARTMENT_OTHER)
Admission: EM | Admit: 2022-10-17 | Discharge: 2022-10-17 | Disposition: A | Discharge status: Home / Self Care | Payer: BC Managed Care – PPO | Attending: Emergency Medicine | Admitting: Emergency Medicine

## 2022-10-17 ENCOUNTER — Encounter (HOSPITAL_BASED_OUTPATIENT_CLINIC_OR_DEPARTMENT_OTHER): Payer: Self-pay

## 2022-10-17 DIAGNOSIS — J020 Streptococcal pharyngitis: Secondary | ICD-10-CM | POA: Insufficient documentation

## 2022-10-17 DIAGNOSIS — Z20828 Contact with and (suspected) exposure to other viral communicable diseases: Secondary | ICD-10-CM | POA: Diagnosis not present

## 2022-10-17 DIAGNOSIS — Z1152 Encounter for screening for COVID-19: Secondary | ICD-10-CM | POA: Insufficient documentation

## 2022-10-17 DIAGNOSIS — J029 Acute pharyngitis, unspecified: Secondary | ICD-10-CM | POA: Diagnosis present

## 2022-10-17 DIAGNOSIS — Z91040 Latex allergy status: Secondary | ICD-10-CM | POA: Insufficient documentation

## 2022-10-17 LAB — GROUP A STREP BY PCR: Group A Strep by PCR: DETECTED — AB

## 2022-10-17 LAB — RESP PANEL BY RT-PCR (FLU A&B, COVID) ARPGX2
Influenza A by PCR: NEGATIVE
Influenza B by PCR: NEGATIVE
SARS Coronavirus 2 by RT PCR: NEGATIVE

## 2022-10-17 MED ORDER — AMOXICILLIN 500 MG PO CAPS: 500.0000 mg | ORAL_CAPSULE | Freq: Two times a day (BID) | ORAL | 0 refills | Status: AC

## 2022-10-17 NOTE — Discharge Instructions (Addendum)
2 of your children tested positive for influenza A and you may develop symptoms of influenza but you are negative for influenza by PCR today and your strep PCR testing was positive.  We will treat you with amoxicillin for strep pharyngitis.  Recommend amoxicillin, also recommend Tylenol and ibuprofen for pain and fever.

## 2022-10-17 NOTE — ED Triage Notes (Signed)
Sore throat and congestion x 2 days.  No fever

## 2022-10-17 NOTE — ED Provider Notes (Addendum)
[facility] Provider Note   CSN: [id number] Arrival date & time: 10/17/22  1740     History  Chief Complaint  Patient presents with   Sore Throat   Nasal Congestion    Kristin Stokes is a 26 y.o. female.   Sore Throat     26 year old female presenting to the emergency department with sore throat and nasal congestion for the past 2 days.  She denies any fevers at home.  She states that symptoms feel like prior episodes of strep pharyngitis.  She has multiple other children in the house that have upper respiratory infectious symptoms.  She is tolerating oral intake, denies any vomiting.  Denies any neck stiffness.  Denies any abdominal pain.  Home Medications Prior to Admission medications   Medication Sig Start Date End Date Taking? Authorizing Provider  amoxicillin (AMOXIL) 500 MG capsule Take 1 capsule (500 mg total) by mouth 2 (two) times daily for 10 days. 10/17/22 10/27/22 Yes Regan Lemming, MD  acetaminophen (TYLENOL) 325 MG tablet Take 2 tablets (650 mg total) by mouth every 6 (six) hours. 06/01/21   Janet Berlin, MD  Blood Pressure Monitoring (BLOOD PRESSURE KIT) DEVI 1 kit by Does not apply route once a week. Check Blood Pressure regularly and record readings into the [employer] App.  Large Cuff.  DX O90.0 10/06/20   Shelly Bombard, MD  ibuprofen (ADVIL) 600 MG tablet Take 1 tablet (600 mg total) by mouth every 6 (six) hours. 06/01/21   Janet Berlin, MD  naproxen (NAPROSYN) 500 MG tablet Take 500 mg by mouth 2 (two) times daily with a meal.    [provider]  norethindrone (ORTHO MICRONOR) 0.35 MG tablet Take 1 tablet (0.35 mg total) by mouth daily. 05/06/22 05/06/23  Leftwich-Kirby, Kathie Dike, CNM  Prenatal Vit-Fe Fumarate-FA (PRENATAL VITAMIN) 27-0.8 MG TABS Take 1 tablet by mouth daily. 01/11/19   Virginia Rochester, NP      Allergies    Latex    Review of Systems   Review of Systems  All other systems reviewed and are  negative.   Physical Exam Updated Vital Signs BP 120/81 (BP Location: Right Arm)   Pulse 100   Temp 98.8 F (37.1 C) (Oral)   Resp 18   Ht _0  (1.753 m)   Wt 86.2 kg   LMP 09/16/2022 (Exact Date)   SpO2 100%   BMI 28.06 kg/m  Physical Exam Vitals and nursing note reviewed.  Constitutional:      General: She is not in acute distress.    Appearance: She is well-developed.  HENT:     Head: Normocephalic and atraumatic.     Mouth/Throat:     Pharynx: Posterior oropharyngeal erythema present. No pharyngeal swelling.     Tonsils: No tonsillar exudate or tonsillar abscesses.  Eyes:     Conjunctiva/sclera: Conjunctivae normal.  Cardiovascular:     Rate and Rhythm: Normal rate and regular rhythm.     Heart sounds: No murmur heard. Pulmonary:     Effort: Pulmonary effort is normal. No respiratory distress.     Breath sounds: Normal breath sounds.  Abdominal:     Palpations: Abdomen is soft.     Tenderness: There is no abdominal tenderness.  Musculoskeletal:        General: No swelling.     Cervical back: Neck supple.  Skin:    General: Skin is warm and dry.     Capillary Refill: Capillary refill takes less  than 2 seconds.  Neurological:     Mental Status: She is alert.  Psychiatric:        Mood and Affect: Mood normal.     ED Results / Procedures / Treatments   Labs (all labs ordered are listed, but only abnormal results are displayed) Labs Reviewed  GROUP A STREP BY PCR - Abnormal; Notable for the following components:      Result Value   Group A Strep by PCR DETECTED (*)    All other components within normal limits  RESP PANEL BY RT-PCR (FLU A&B, COVID) [id number]  CULTURE, GROUP A STREP [facility])    EKG None  Radiology No results found.  Procedures Procedures    Medications Ordered in ED Medications - No data to display  ED Course/ Medical Decision Making/ A&P Clinical Course as of 10/17/22 1907  Mon Oct 17, 2022  1847 Group A Strep by PCR(!):  DETECTED [JL]    Clinical Course User Index [JL] Regan Lemming, MD                           Medical Decision Making Amount and/or Complexity of Data Reviewed Labs:  Decision-making details documented in ED Course.  Risk Prescription drug management.      26 year old female presenting to the emergency department with sore throat and nasal congestion for the past 2 days.  She denies any fevers at home.  She states that symptoms feel like prior episodes of strep pharyngitis.  She has multiple other children in the house that have upper respiratory infectious symptoms.  She is tolerating oral intake, denies any vomiting.  Denies any neck stiffness.  Denies any abdominal pain.  On arrival, the patient was afebrile, pulse 100, BP 120/81, saturating her percent on room air.  Physical exam significant for mild oropharyngeal erythema, no evidence of exudate.  The patient has intact range of motion of the neck with no clear signs of PTA.  Low suspicion for PTA or RPA.  Differential diagnosis includes viral URI, COVID-19, influenza, strep pharyngitis.  The patient's COVID-19 influenza PCR testing was collected and resulted negative.  Her strep PCR testing did result positive.  We will go ahead and treat with amoxicillin 500 mg twice daily.  She while waiting in the emergency department had 2 children that did test positive for influenza.  Given her negative influenza PCR testing today, will hold on treatment with Tamiflu at this time.  Return precautions provided.  Overall well-appearing, stable for discharge with outpatient PCP follow-up as needed.    Final Clinical Impression(s) / ED Diagnoses Final diagnoses:  Strep pharyngitis  Exposure to influenza    Rx / DC Orders ED Discharge Orders          Ordered    amoxicillin (AMOXIL) 500 MG capsule  2 times daily        10/17/22 1904              Regan Lemming, MD 10/17/22 Darlin Drop    Regan Lemming, MD 10/17/22 1907

## 2022-10-17 NOTE — ED Notes (Signed)
RN provided AVS using Teachback Method. Patient verbalizes understanding of Discharge Instructions. Opportunity for Questioning and Answers were provided by RN. Patient Discharged from ED ambulatory to Home with Family. ? ?

## 2022-10-18 LAB — CULTURE, GROUP A STREP (THRC)

## 2022-10-19 ENCOUNTER — Telehealth (HOSPITAL_BASED_OUTPATIENT_CLINIC_OR_DEPARTMENT_OTHER): Payer: Self-pay

## 2022-10-19 NOTE — Telephone Encounter (Signed)
Post ED Visit - Positive Culture Follow-up  Culture report reviewed by antimicrobial stewardship pharmacist: [facility] [x]  , Pharm.D. []  Daylene Posey, Pharm.D., BCPS AQ-ID []  , Pharm.D., BCPS []  Celedonio Miyamoto, Pharm.D., BCPS []  [place], Garvin Fila.D., BCPS, AAHIVP []  , Pharm.D., BCPS, AAHIVP []  Georgina Pillion, PharmD, BCPS []  , PharmD, BCPS []  [place], PharmD, BCPS []  [address], PharmD []  , PharmD, BCPS []  Estella Husk, PharmD  Pharmacy Team []  Lysle Pearl, PharmD []  , PharmD []  Phillips Climes, PharmD []  , Rph []  Agapito Games) , PharmD []  Verlan Friends, PharmD []  , PharmD []  Mervyn Gay, PharmD []  , PharmD []  Vinnie Level, PharmD []  [facility], PharmD []  , PharmD []  Len Childs, PharmD   Positive urine culture Treated with Amoxicillin, organism sensitive to the same and no further patient follow-up is required at this time.  10/19/2022, 9:10 AM

## 2022-10-26 ENCOUNTER — Ambulatory Visit (INDEPENDENT_AMBULATORY_CARE_PROVIDER_SITE_OTHER): Payer: BC Managed Care – PPO | Admitting: *Deleted

## 2022-10-26 VITALS — BP 120/78 | HR 91

## 2022-10-26 DIAGNOSIS — Z3202 Encounter for pregnancy test, result negative: Secondary | ICD-10-CM | POA: Diagnosis not present

## 2022-10-26 DIAGNOSIS — Z3480 Encounter for supervision of other normal pregnancy, unspecified trimester: Secondary | ICD-10-CM

## 2022-10-26 DIAGNOSIS — Z3200 Encounter for pregnancy test, result unknown: Secondary | ICD-10-CM

## 2022-10-26 LAB — POCT URINE PREGNANCY: Preg Test, Ur: POSITIVE — AB

## 2022-10-26 MED ORDER — PRENATE MINI 18-0.6-0.4-350 MG PO CAPS: 1.0000 | ORAL_CAPSULE | Freq: Every day | ORAL | 11 refills | Status: DC

## 2022-10-26 NOTE — Progress Notes (Signed)
Kristin Stokes presents today for UPT. She has no unusual complaints. LMP: Unknown, No menses Oct or Nov    OBJECTIVE: Appears well, in no apparent distress.  OB History     Gravida  3   Para  3   Term  3   Preterm      AB      Living  3      SAB      IAB      Ectopic      Multiple  0   Live Births  3          Home UPT Result: positive In-Office UPT result: positive I have reviewed the patient's medical, obstetrical, social, and family histories, and medications.   ASSESSMENT: Positive pregnancy test  PLAN Patient reports plans to terminate pregnancy.

## 2022-12-22 ENCOUNTER — Ambulatory Visit: Payer: BC Managed Care – PPO | Admitting: Obstetrics and Gynecology

## 2023-01-25 ENCOUNTER — Ambulatory Visit
Admission: RE | Admit: 2023-01-25 | Discharge: 2023-01-25 | Disposition: A | Discharge status: Home / Self Care | Payer: BC Managed Care – PPO | Source: Ambulatory Visit | Attending: Internal Medicine | Admitting: Internal Medicine

## 2023-01-25 ENCOUNTER — Other Ambulatory Visit: Payer: BC Managed Care – PPO

## 2023-01-25 ENCOUNTER — Other Ambulatory Visit: Payer: Self-pay | Admitting: Internal Medicine

## 2023-01-25 DIAGNOSIS — M545 Low back pain, unspecified: Secondary | ICD-10-CM

## 2023-05-16 ENCOUNTER — Other Ambulatory Visit: Payer: Self-pay | Admitting: Internal Medicine

## 2023-05-16 DIAGNOSIS — E010 Iodine-deficiency related diffuse (endemic) goiter: Secondary | ICD-10-CM

## 2023-05-18 ENCOUNTER — Ambulatory Visit
Admission: RE | Admit: 2023-05-18 | Discharge: 2023-05-18 | Disposition: A | Discharge status: Home / Self Care | Payer: BC Managed Care – PPO | Source: Ambulatory Visit | Attending: Internal Medicine | Admitting: Internal Medicine

## 2023-05-18 DIAGNOSIS — E010 Iodine-deficiency related diffuse (endemic) goiter: Secondary | ICD-10-CM

## 2024-04-10 ENCOUNTER — Ambulatory Visit

## 2024-04-10 ENCOUNTER — Other Ambulatory Visit (HOSPITAL_COMMUNITY)
Admission: RE | Admit: 2024-04-10 | Discharge: 2024-04-10 | Disposition: A | Discharge status: Home / Self Care | Source: Ambulatory Visit

## 2024-04-10 ENCOUNTER — Other Ambulatory Visit: Payer: Self-pay

## 2024-04-10 VITALS — BP 119/82 | HR 81 | Ht 69.0 in | Wt 201.6 lb

## 2024-04-10 DIAGNOSIS — Z124 Encounter for screening for malignant neoplasm of cervix: Secondary | ICD-10-CM | POA: Insufficient documentation

## 2024-04-10 DIAGNOSIS — N644 Mastodynia: Secondary | ICD-10-CM

## 2024-04-10 DIAGNOSIS — Z113 Encounter for screening for infections with a predominantly sexual mode of transmission: Secondary | ICD-10-CM

## 2024-04-10 DIAGNOSIS — Z1239 Encounter for other screening for malignant neoplasm of breast: Secondary | ICD-10-CM

## 2024-04-10 DIAGNOSIS — Z01419 Encounter for gynecological examination (general) (routine) without abnormal findings: Secondary | ICD-10-CM

## 2024-04-10 NOTE — Progress Notes (Signed)
 GYNECOLOGY OFFICE VISIT NOTE-WELL WOMAN EXAM  History:    Kristin Stokes is a 28 year old here today for annual exam.   Birth Control:  None-Okay with pregnancy.   Reproductive Concerns Sexually Active: Partners Type: Female Number of partners in last year: One STD Testing: Full  Obstetrical History: [id number] -Vaginal Gynecological History: No history of abnormal paps.    Vaginal/GU Concerns:She denies any abnormal vaginal discharge, bleeding, pelvic pain. LMP was 04/02/2024 and lasts 7 days.  Flow is moderate to heavy, but uses regular pads. No cramping. No itching, irritation, or odor. No issues with urination, constipation, or diarrhea.    Breast Concerns/Exams: Recently discontinued breastfeeding. She reports she has been ongoing, intermittently, for months. Noticed nipple on right is thicker and sometimes causes pain.  She states the pain is constant and can lasts days.  It is tender to touch and is "more so on the inside."  She states the pain is localized to areola/nipple area. She reports the pain is worse with touch, but she can wear bras when pain is present. No relieving factors.   She has  not noted any associated factors r/t onset.   Patient denies family history of breast, uterine, cervical, or ovarian cancer  Medical and Nutrition PCP: [facility]-March 11th Significant PMx: None Exercise: "Moderately" 2-3x/week 20-42min sessions. Walking/Occasional Workout video.  Tobacco/Drugs/Alcohol/Vaping: Occasional alcohol usage.  Nutrition: Endorses balanced intake  Social Safety at home: Endorses. 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   Health Maintenance: Pap: December 2020, Normal Results.  Mammogram: Diagnostic Colonoscopy: N/A Review of Systems:  Pertinent items noted in HPI and remainder of comprehensive ROS otherwise negative.    Objective:    Physical Exam BP 119/82   Pulse 81   Ht 5\' 9"  (1.753 m)   Wt 201 lb 9.6 oz (91.4 kg)   LMP 04/02/2024 (Approximate)   BMI 29.77 kg/m  Physical Exam Vitals reviewed. Exam conducted with a chaperone present Jyl Or, RN).  Constitutional:      Appearance: Normal appearance.  HENT:     Head: Normocephalic.  Eyes:     Conjunctiva/sclera: Conjunctivae normal.  Cardiovascular:     Rate and Rhythm: Normal rate and regular rhythm.  Pulmonary:     Effort: Pulmonary effort is normal. No respiratory distress.     Breath sounds: Normal breath sounds.  Chest:  Breasts:    Right: Skin change (Thickening around nipple) present. No nipple discharge or tenderness.     Left: No nipple discharge or tenderness.     Comments: CBE completed No current tenderness. Nipple of right breast appears normal, but skin at base of nipple thicker or slightly edematous.  Abdominal:     General: Bowel sounds are normal.     Palpations: Abdomen is soft.     Tenderness: There is no abdominal tenderness.  Genitourinary:    Labia:        Right: No tenderness or lesion.        Left: No tenderness or lesion.  Vagina: No vaginal discharge or bleeding.     Cervix: Friability present.     Uterus: Not enlarged and not tender.      Comments: Pap collected with broom and spatula. Cervix with some friability after collection. CV collected from posterior fornix.  BME with no apparent tenderness or enlargement.  Adnexa not appreciated.   Musculoskeletal:     Cervical back: Normal range of motion.  Skin:    General: Skin is warm and dry.  Neurological:      Mental Status: She is alert and oriented to person, place, and time.  Psychiatric:        Mood and Affect: Mood normal.        Behavior: Behavior normal.      Labs and Imaging No results found for this or any previous visit (from the past week). No results found.   Assessment & Plan:   28 year old Female Well Woman Exam Pap Smear Breast Exam STD Screening C/O Nipple tenderness    1. Well woman exam with routine gynecological exam (Primary) -Exam performed and findings discussed. -Encouraged to activate and utilize Mychart for reviewing of results, communication with office, and scheduling of appts. -Educated on AHA exercise recommendations of 30 minutes of moderate to vigorous activity at least 5x/week.   - Cytology - PAP( [place]) - Cervicovaginal ancillary only( [place]) - HepB+HepC+HIV Panel - [facility]  2. Pap smear for cervical cancer screening -Pap collected -Educated on ASCCP guidelines regarding pap smear evaluation and frequency. -Informed of turnover time and provider/clinic policy on releasing results. - Cytology - PAP( [place])  3. Encounter for screening breast examination -CBE completed -Nipple tenderness reported.  -Educated and encouraged to continue SBE with increased breast awareness including examination of breast for skin changes, moles, tenderness, etc.  4. Encounter for screening examination for sexually transmitted disease -Will treat accordingly.  - Cervicovaginal ancillary only( ) - HepB+HepC+HIV Panel - [facility]  5. Nipple tenderness -Order placed for diagnostic mammogram. -Discussed attempting to schedule appt when symptoms are occurring.    Routine preventative health maintenance measures emphasized. Please refer to After Visit Summary for other counseling recommendations.   No follow-ups on file.      Kraig Peru, CNM 04/10/2024

## 2024-04-10 NOTE — Progress Notes (Signed)
 Pt states her right nipple is thicker than her left with occasional pain (scale of 6). Just finished breastfeeding two weeks ago. Pt seen lactation consultant concerning breast and was referred here.  There is a lump present on the right breast.   Pt declines birth control at this time.   No other concerns at this time

## 2024-04-11 LAB — CERVICOVAGINAL ANCILLARY ONLY
Chlamydia: NEGATIVE
Comment: NEGATIVE
Comment: NEGATIVE
Comment: NORMAL
Neisseria Gonorrhea: NEGATIVE
Trichomonas: NEGATIVE

## 2024-04-11 LAB — HEPB+HEPC+HIV PANEL
HIV Screen 4th Generation wRfx: NONREACTIVE
Hep B C IgM: NEGATIVE
Hep B Core Total Ab: NEGATIVE
Hep B E Ab: NONREACTIVE
Hep B E Ag: NEGATIVE
Hep B Surface Ab, Qual: NONREACTIVE
Hep C Virus Ab: NONREACTIVE
Hepatitis B Surface Ag: NEGATIVE

## 2024-04-11 LAB — RPR: RPR Ser Ql: NONREACTIVE

## 2024-04-12 ENCOUNTER — Ambulatory Visit: Payer: Self-pay

## 2024-04-12 LAB — CYTOLOGY - PAP
Comment: NEGATIVE
Diagnosis: NEGATIVE
High risk HPV: NEGATIVE

## 2024-05-07 ENCOUNTER — Ambulatory Visit
Admission: RE | Admit: 2024-05-07 | Discharge: 2024-05-07 | Disposition: A | Discharge status: Home / Self Care | Source: Ambulatory Visit

## 2024-05-07 DIAGNOSIS — N644 Mastodynia: Secondary | ICD-10-CM

## 2024-06-24 ENCOUNTER — Telehealth (INDEPENDENT_AMBULATORY_CARE_PROVIDER_SITE_OTHER): Admitting: Obstetrics and Gynecology

## 2024-06-24 ENCOUNTER — Encounter: Payer: Self-pay | Admitting: Obstetrics and Gynecology

## 2024-06-24 DIAGNOSIS — Z3009 Encounter for other general counseling and advice on contraception: Secondary | ICD-10-CM

## 2024-06-24 MED ORDER — JUNEL FE 24 1-20 MG-MCG(24) PO TABS: 1.0000 | ORAL_TABLET | Freq: Every day | ORAL | 3 refills | Status: AC

## 2024-06-24 NOTE — Progress Notes (Unsigned)
   TELEHEALTH GYNECOLOGY VISIT ENCOUNTER NOTE  Provider location: [facility]   Patient location: Home  I connected with Kristin Stokes on 06/24/24 at 4:40PM by MyChart audiovisual encounter   History:  Kristin Stokes is a 28 y.o. [phone] presenting for sterilization consultation/contraceptive counseling  Has completed childbearing. Didn't like IUD, hasnt done Nexplanon. Partner not willing to do vasectomy. 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 She would like to get back on birth control pills.   No contraindications to estrogen.    Abortion 2-3 weeks ago, then bleeding for around 2 weeks No unprotected intercourse since AB  Pap UTD   Past Medical History:  Diagnosis Date   Anemia    COVID-19 11/17/2020   Genital herpes    Past Surgical History:  Procedure Laterality Date   ANKLE SURGERY Right    The following portions of the patient's history were reviewed and updated as appropriate: allergies, current medications, past family history, past medical history, past social history, past surgical history and problem list.   Review of Systems:  Pertinent items noted in HPI and remainder of comprehensive ROS otherwise negative.  Physical Exam:   General:  Alert, oriented and cooperative.   Mental Status: Normal mood and affect perceived. Normal judgment and thought content.  Physical exam deferred due to nature of the encounter  Labs and Imaging No results found for this or any previous visit (from the past 2 weeks). No results found.    Assessment and Plan:   Consultation for female sterilization Encounter for general counseling and advice on contraceptive management - She is interested permanent sterilization. Discussed alternatives including LARC options and vasectomy. - Discussed surgery of salpingectomy vs tubal ligation. Reviewed salpingectomy  has benefit of lower contraceptive failure rate and potential ovarian cancer risk reduction - Risks of surgery include but are not limited to: bleeding, infection, injury to surrounding organs/tissues (i.e. bowel/bladder/ureters), need for additional procedures, wound complications, hospital re-admission, and conversion to open surgery, VTE. We reviewed risk of contraceptive failure and risk of regret.  - Reviewed restrictions and recovery following surgery - Plans to use COC for now, will let us  know if she ultimately decides to proceed with salpingctomy -     Norethindrone  Acetate-Ethinyl Estrad-FE (JUNEL  FE 24) 1-20 MG-MCG(24) tablet; Take 1 tablet by mouth daily.  I provided 10 minutes of non-face-to-face time during this encounter.  Kieth JAYSON Carolin, MD [facility] [employer], [facility]

## 2024-06-25 ENCOUNTER — Encounter: Payer: Self-pay | Admitting: Obstetrics and Gynecology

## 2024-07-15 ENCOUNTER — Ambulatory Visit: Admitting: Obstetrics and Gynecology

## 2024-11-11 ENCOUNTER — Encounter: Payer: Self-pay | Admitting: Obstetrics and Gynecology

## 2024-12-04 ENCOUNTER — Encounter (HOSPITAL_COMMUNITY): Payer: Self-pay | Admitting: Emergency Medicine

## 2024-12-04 ENCOUNTER — Ambulatory Visit (HOSPITAL_COMMUNITY)
Admission: EM | Admit: 2024-12-04 | Discharge: 2024-12-04 | Disposition: A | Discharge status: Home / Self Care | Attending: Nurse Practitioner | Admitting: Nurse Practitioner

## 2024-12-04 DIAGNOSIS — J029 Acute pharyngitis, unspecified: Secondary | ICD-10-CM | POA: Diagnosis present

## 2024-12-04 DIAGNOSIS — Z8709 Personal history of other diseases of the respiratory system: Secondary | ICD-10-CM | POA: Insufficient documentation

## 2024-12-04 LAB — POCT RAPID STREP A (OFFICE): Rapid Strep A Screen: NEGATIVE

## 2024-12-04 MED ORDER — LIDOCAINE VISCOUS HCL 2 % MT SOLN: 15.0000 mL | OROMUCOSAL | 0 refills | Status: AC | PRN

## 2024-12-04 MED ORDER — AMOXICILLIN 500 MG PO CAPS: 500.0000 mg | ORAL_CAPSULE | Freq: Two times a day (BID) | ORAL | 0 refills | Status: AC

## 2024-12-04 NOTE — ED Provider Notes (Signed)
 " [facility]    CSN: [id number] Arrival date & time: 12/04/24  1409      History   Chief Complaint Chief Complaint  Patient presents with   Sore Throat    HPI Kristin Stokes is a 29 y.o. female.   Patient presents today with 1 day history of sore throat, getting worse as the day went on.  She reports her throat feels inflamed.  No fever, body aches or chills, cough, congestion.  She does endorse some postnasal drainage.  No headache, ear pain, abdominal pain, nausea/vomiting, or diarrhea.  No known sick contacts.  Has not taken anything for symptoms so far besides cough drops which did seem to help get her through the workday.  She is a runner, broadcasting/film/video.    Past Medical History:  Diagnosis Date   Anemia    COVID-19 11/17/2020   Genital herpes     Patient Active Problem List   Diagnosis Date Noted   Shoulder dystocia, delivered 05/30/2021   History of ELISA positive for HSV 05/10/2021    Past Surgical History:  Procedure Laterality Date   ANKLE SURGERY Right     OB History     Gravida  4   Para  3   Term  3   Preterm      AB  1   Living  3      SAB      IAB  1   Ectopic      Multiple  0   Live Births  3            Home Medications    Prior to Admission medications  Medication Sig Start Date End Date Taking? Authorizing Provider  amoxicillin  (AMOXIL ) 500 MG capsule Take 1 capsule (500 mg total) by mouth 2 (two) times daily for 10 days. 12/04/24 12/14/24 Yes Chandra Harlene LABOR, NP  lidocaine  (XYLOCAINE ) 2 % solution Use as directed 15 mLs in the mouth or throat every 3 (three) hours as needed for mouth pain. 12/04/24  Yes Chandra Harlene LABOR, NP  Blood Pressure Monitoring (BLOOD PRESSURE KIT) DEVI 1 kit by Does not apply route once a week. Check Blood Pressure regularly and record readings into the Bab[employer] App.  Large Cuff.  DX O90.0 Patient not taking: Reported on 04/10/2024 10/06/20   Rudy Carlin LABOR, MD  Norethindrone  Acetate-Ethinyl  Estrad-FE (JUNEL  FE 24) 1-20 MG-MCG(24) tablet Take 1 tablet by mouth daily. 06/24/24   Erik Kieth BROCKS, MD    Family History Family History  Problem Relation Age of Onset   Asthma Mother    Anxiety disorder Father    Hypertension Other    Diabetes Other    Cancer Other    COPD Maternal Grandmother    Asthma Maternal Grandmother    Hypertension Maternal Grandmother     Social History Social History[1]   Allergies   Latex   Review of Systems Review of Systems Per HPI  Physical Exam Triage Vital Signs ED Triage Vitals  Encounter Vitals Group     BP 12/04/24 1549 128/89     Girls Systolic BP Percentile --      Girls Diastolic BP Percentile --      Boys Systolic BP Percentile --      Boys Diastolic BP Percentile --      Pulse Rate 12/04/24 1549 94     Resp 12/04/24 1549 15     Temp 12/04/24 1549 98.8 F (37.1 C)  Temp Source 12/04/24 1549 Oral     SpO2 12/04/24 1549 97 %     Weight --      Height --      Head Circumference --      Peak Flow --      Pain Score 12/04/24 1548 5     Pain Loc --      Pain Education --      Exclude from Growth Chart --    No data found.  Updated Vital Signs BP 128/89 (BP Location: Left Arm)   Pulse 94   Temp 98.8 F (37.1 C) (Oral)   Resp 15   LMP 11/29/2024 (Exact Date)   SpO2 97%   Visual Acuity Right Eye Distance:   Left Eye Distance:   Bilateral Distance:    Right Eye Near:   Left Eye Near:    Bilateral Near:     Physical Exam Vitals and nursing note reviewed.  Constitutional:      General: She is not in acute distress.    Appearance: She is well-developed. She is not toxic-appearing.  HENT:     Head: Normocephalic and atraumatic.     Left Ear:  No middle ear effusion.     Nose: No congestion or rhinorrhea.     Mouth/Throat:     Mouth: Mucous membranes are moist.     Pharynx: Oropharynx is clear. Uvula midline. Posterior oropharyngeal erythema present. No oropharyngeal exudate.     Tonsils: No  tonsillar exudate.  Eyes:     Extraocular Movements:     Right eye: Normal extraocular motion.     Left eye: Normal extraocular motion.  Cardiovascular:     Rate and Rhythm: Normal rate and regular rhythm.  Pulmonary:     Effort: Pulmonary effort is normal. No respiratory distress.     Breath sounds: Normal breath sounds. No wheezing, rhonchi or rales.  Musculoskeletal:     Cervical back: Normal range of motion and neck supple.  Lymphadenopathy:     Cervical: No cervical adenopathy.  Skin:    General: Skin is warm and dry.     Capillary Refill: Capillary refill takes less than 2 seconds.     Coloration: Skin is not pale.     Findings: No erythema or rash.  Neurological:     Mental Status: She is alert and oriented to person, place, and time.  Psychiatric:        Behavior: Behavior is cooperative.      UC Treatments / Results  Labs (all labs ordered are listed, but only abnormal results are displayed) Labs Reviewed  POCT RAPID STREP A (OFFICE)    EKG   Radiology No results found.  Procedures Procedures (including critical care time)  Medications Ordered in UC Medications - No data to display  Initial Impression / Assessment and Plan / UC Course  I have reviewed the triage vital signs and the nursing notes.  Pertinent labs & imaging results that were available during my care of the patient were reviewed by me and considered in my medical decision making (see chart for details).   Patient is a very pleasant, well-appearing 29 year old female presenting today for acute pharyngitis.  Vital signs are stable.  Rapid strep throat test is negative today.  Given history of strep throat, exam with erythema posterior pharynx, treat with amoxicillin  twice daily for 10 days.  Change toothbrush after starting treatment.  Other supportive care discussed.  ER and return precautions discussed.  Work  excuse provided.  The patient was given the opportunity to ask questions.  All  questions answered to their satisfaction.  The patient is in agreement to this plan.   Final Clinical Impressions(s) / UC Diagnoses   Final diagnoses:  Acute pharyngitis, unspecified etiology  History of strep pharyngitis     Discharge Instructions      Strep throat test is negative today.  The throat culture is pending.  Will contact you if positive later this week.  In the meantime, take the amoxicillin  twice daily for 10 days as prescribed to treat strep throat.  Change toothbrush after starting treatment to prevent reinfection.  Start lidocaine  rinses as needed for throat pain.  Seek care if symptoms persist or worsen despite treatment.    ED Prescriptions     Medication Sig Dispense Auth. Provider   amoxicillin  (AMOXIL ) 500 MG capsule Take 1 capsule (500 mg total) by mouth 2 (two) times daily for 10 days. 20 capsule Chandra Raisin A, NP   lidocaine  (XYLOCAINE ) 2 % solution Use as directed 15 mLs in the mouth or throat every 3 (three) hours as needed for mouth pain. 100 mL Chandra Raisin LABOR, NP      PDMP not reviewed this encounter.    [1]  Social History Tobacco Use   Smoking status: Never   Smokeless tobacco: Never  Vaping Use   Vaping status: Never Used  Substance Use Topics   Alcohol use: Not Currently    Comment: Occasionally   Drug use: Not Currently    Comment: not since confirmed pregnancy     Chandra Raisin LABOR, NP 12/04/24 1620  "

## 2024-12-04 NOTE — Discharge Instructions (Signed)
 Strep throat test is negative today.  The throat culture is pending.  Will contact you if positive later this week.  In the meantime, take the amoxicillin  twice daily for 10 days as prescribed to treat strep throat.  Change toothbrush after starting treatment to prevent reinfection.  Start lidocaine  rinses as needed for throat pain.  Seek care if symptoms persist or worsen despite treatment.

## 2024-12-04 NOTE — ED Triage Notes (Signed)
 Pt reports sore throat started today and got worse as day went on. Reports feels like throat getting larger.

## 2024-12-07 LAB — CULTURE, GROUP A STREP (THRC)

## 2024-12-21 ENCOUNTER — Emergency Department (HOSPITAL_COMMUNITY)
Admission: EM | Admit: 2024-12-21 | Discharge: 2024-12-21 | Disposition: A | Discharge status: Home / Self Care | Attending: Emergency Medicine | Admitting: Emergency Medicine

## 2024-12-21 ENCOUNTER — Other Ambulatory Visit: Payer: Self-pay

## 2024-12-21 ENCOUNTER — Encounter (HOSPITAL_COMMUNITY): Payer: Self-pay

## 2024-12-21 DIAGNOSIS — Z91040 Latex allergy status: Secondary | ICD-10-CM | POA: Diagnosis not present

## 2024-12-21 DIAGNOSIS — R000 Tachycardia, unspecified: Secondary | ICD-10-CM | POA: Diagnosis not present

## 2024-12-21 DIAGNOSIS — J020 Streptococcal pharyngitis: Secondary | ICD-10-CM | POA: Diagnosis not present

## 2024-12-21 DIAGNOSIS — J029 Acute pharyngitis, unspecified: Secondary | ICD-10-CM | POA: Diagnosis present

## 2024-12-21 LAB — RESP PANEL BY RT-PCR (RSV, FLU A&B, COVID)  RVPGX2
Influenza A by PCR: NEGATIVE
Influenza B by PCR: NEGATIVE
Resp Syncytial Virus by PCR: NEGATIVE
SARS Coronavirus 2 by RT PCR: NEGATIVE

## 2024-12-21 LAB — GROUP A STREP BY PCR: Group A Strep by PCR: DETECTED — AB

## 2024-12-21 MED ORDER — OXYCODONE-ACETAMINOPHEN 5-325 MG PO TABS: 1.0000 | ORAL_TABLET | Freq: Four times a day (QID) | ORAL | 0 refills | Status: AC | PRN

## 2024-12-21 MED ORDER — FLUCONAZOLE 150 MG PO TABS: 150.0000 mg | ORAL_TABLET | Freq: Once | ORAL | 0 refills | Status: AC | PRN

## 2024-12-21 MED ORDER — AMOXICILLIN-POT CLAVULANATE 875-125 MG PO TABS: 1.0000 | ORAL_TABLET | Freq: Two times a day (BID) | ORAL | 0 refills | Status: AC

## 2024-12-21 NOTE — ED Triage Notes (Signed)
 Pt states that she was seen on 12/04/24 for sore throat and had negative strep test but was prescribed amoxicillin . Pt completed this 3 days ago. Pt has had increase in sore throat and pain with swallowing since this time.

## 2024-12-21 NOTE — ED Provider Notes (Signed)
 " [place] EMERGENCY DEPARTMENT AT [facility] Provider Note   CSN: [id number] Arrival date & time: 12/21/24  9343     Patient presents with: Sore Throat   Kristin Stokes is a 29 y.o. female.    Sore Throat  Patient is had sore throat.  Seen in urgent care on January 7.  Started on amoxicillin  despite negative strep test.  Reportedly had bad looking throat.  Had been doing somewhat better and finished up the antibiotics earlier this week.  However now increasing pain.  No fevers.  Does have pain with swallowing.  Rare cough.     Prior to Admission medications  Medication Sig Start Date End Date Taking? Authorizing Provider  amoxicillin -clavulanate (AUGMENTIN ) 875-125 MG tablet Take 1 tablet by mouth every 12 (twelve) hours. 12/21/24  Yes Patsey Lot, MD  fluconazole  (DIFLUCAN ) 150 MG tablet Take 1 tablet (150 mg total) by mouth once as needed for up to 1 dose. Can take partway through the antibiotic treatment and 1 at the end 12/21/24  Yes Patsey Lot, MD  oxyCODONE -acetaminophen  (PERCOCET/ROXICET) 5-325 MG tablet Take 1 tablet by mouth every 6 (six) hours as needed for severe pain (pain score 7-10). 12/21/24  Yes Patsey Lot, MD  Blood Pressure Monitoring (BLOOD PRESSURE KIT) DEVI 1 kit by Does not apply route once a week. Check Blood Pressure regularly and record readings into the Babyscripts App.  Large Cuff.  DX O90.0 Patient not taking: Reported on 04/10/2024 10/06/20   Rudy Carlin LABOR, MD  lidocaine  (XYLOCAINE ) 2 % solution Use as directed 15 mLs in the mouth or throat every 3 (three) hours as needed for mouth pain. 12/04/24   Chandra Harlene LABOR, NP  Norethindrone  Acetate-Ethinyl Estrad-FE (JUNEL  FE 24) 1-20 MG-MCG(24) tablet Take 1 tablet by mouth daily. 06/24/24   Erik Kieth BROCKS, MD    Allergies: Latex    Review of Systems  Updated Vital Signs BP (!) 134/93 (BP Location: Left Arm)   Pulse (!) 125   Temp 99.3 F (37.4 C) (Oral)   Resp 16    LMP 11/29/2024 (Exact Date)   SpO2 99%   Physical Exam Vitals and nursing note reviewed.  HENT:     Head: Atraumatic.     Mouth/Throat:     Tonsils: Tonsillar exudate present.     Comments: Patient with some swelling of bilateral tonsils.  Does have plaques diffusely in the posterior pharynx.  No asymmetric swelling. Cardiovascular:     Rate and Rhythm: Tachycardia present.  Pulmonary:     Breath sounds: No wheezing or rhonchi.  Skin:    Capillary Refill: Capillary refill takes less than 2 seconds.     (all labs ordered are listed, but only abnormal results are displayed) Labs Reviewed  GROUP A STREP BY PCR - Abnormal; Notable for the following components:      Result Value   Group A Strep by PCR DETECTED (*)    All other components within normal limits  RESP PANEL BY RT-PCR (RSV, FLU A&B, COVID)  [id number]    EKG: None  Radiology: No results found.   Procedures   Medications Ordered in the ED - No data to display                                  Medical Decision Making Amount and/or Complexity of Data Reviewed Labs: ordered.   Patient with sore throat.  Recently on  antibiotics.  Negative strep culture previously.  However current symptoms after taking antibiotics.  Differential diagnose includes incompletely treated strep but also other causes such as peritonsillar abscess or different infection.  Does have plaques.  Initial plan to add on mononucleosis screen, however his strep test came back positive I think this would be clinically the cause.  Planned for IM penicillin  however on national shortage and unable to give.  Will give Augmentin  now along with some Diflucan  since she has recurrent yeast infections.  Will give symptomatic treatment for pain and discharge home.     Final diagnoses:  Strep pharyngitis    ED Discharge Orders          Ordered    amoxicillin -clavulanate (AUGMENTIN ) 875-125 MG tablet  Every 12 hours        12/21/24 0757     oxyCODONE -acetaminophen  (PERCOCET/ROXICET) 5-325 MG tablet  Every 6 hours PRN        12/21/24 0757    fluconazole  (DIFLUCAN ) 150 MG tablet  Once PRN        12/21/24 0757               Patsey Lot, MD 12/21/24 [phone]  "
